# Patient Record
Sex: Female | Born: 1940
Health system: Southern US, Community
[De-identification: ages and names within clinical notes are randomized; demographics above are authoritative.]

## PROBLEM LIST (undated history)

## (undated) DIAGNOSIS — E7800 Pure hypercholesterolemia, unspecified: Secondary | ICD-10-CM

## (undated) DIAGNOSIS — I10 Essential (primary) hypertension: Secondary | ICD-10-CM

---

## 2015-09-21 ENCOUNTER — Encounter (HOSPITAL_BASED_OUTPATIENT_CLINIC_OR_DEPARTMENT_OTHER): Payer: Self-pay | Admitting: *Deleted

## 2015-09-21 ENCOUNTER — Emergency Department (HOSPITAL_BASED_OUTPATIENT_CLINIC_OR_DEPARTMENT_OTHER)
Admission: EM | Admit: 2015-09-21 | Discharge: 2015-09-21 | Disposition: A | Discharge status: Home / Self Care | Payer: Medicare HMO | Attending: Emergency Medicine | Admitting: Emergency Medicine

## 2015-09-21 ENCOUNTER — Emergency Department (HOSPITAL_BASED_OUTPATIENT_CLINIC_OR_DEPARTMENT_OTHER): Payer: Medicare HMO

## 2015-09-21 DIAGNOSIS — I10 Essential (primary) hypertension: Secondary | ICD-10-CM | POA: Diagnosis not present

## 2015-09-21 DIAGNOSIS — E7800 Pure hypercholesterolemia, unspecified: Secondary | ICD-10-CM | POA: Insufficient documentation

## 2015-09-21 DIAGNOSIS — M542 Cervicalgia: Secondary | ICD-10-CM | POA: Diagnosis present

## 2015-09-21 DIAGNOSIS — F17200 Nicotine dependence, unspecified, uncomplicated: Secondary | ICD-10-CM | POA: Diagnosis not present

## 2015-09-21 DIAGNOSIS — M47812 Spondylosis without myelopathy or radiculopathy, cervical region: Secondary | ICD-10-CM | POA: Diagnosis not present

## 2015-09-21 HISTORY — DX: Essential (primary) hypertension: I10

## 2015-09-21 HISTORY — DX: Pure hypercholesterolemia, unspecified: E78.00

## 2015-09-21 MED ORDER — OXYCODONE-ACETAMINOPHEN 5-325 MG PO TABS: 1.0000 | ORAL_TABLET | ORAL | Status: DC | PRN

## 2015-09-21 MED ORDER — ONDANSETRON HCL 4 MG PO TABS: 4.0000 mg | ORAL_TABLET | Freq: Four times a day (QID) | ORAL | Status: DC | PRN

## 2015-09-21 MED ORDER — ONDANSETRON 8 MG PO TBDP
8.0000 mg | ORAL_TABLET | Freq: Once | ORAL | Status: AC
  Administered 2015-09-21: 8 mg via ORAL
  Filled 2015-09-21: qty 1

## 2015-09-21 MED ORDER — CYCLOBENZAPRINE HCL 10 MG PO TABS
10.0000 mg | ORAL_TABLET | Freq: Once | ORAL | Status: AC
  Administered 2015-09-21: 10 mg via ORAL
  Filled 2015-09-21: qty 1

## 2015-09-21 MED ORDER — OXYCODONE-ACETAMINOPHEN 5-325 MG PO TABS
1.0000 | ORAL_TABLET | Freq: Once | ORAL | Status: AC
  Administered 2015-09-21: 1 via ORAL
  Filled 2015-09-21: qty 1

## 2015-09-21 MED ORDER — CYCLOBENZAPRINE HCL 10 MG PO TABS: 10.0000 mg | ORAL_TABLET | Freq: Three times a day (TID) | ORAL | Status: DC | PRN

## 2015-09-21 NOTE — ED Provider Notes (Signed)
CSN: [id number]     Arrival date & time 09/21/15  0138 History   First MD Initiated Contact with Patient 09/21/15 0353     Chief Complaint  Patient presents with  . Neck Injury     (Consider location/radiation/quality/duration/timing/severity/associated sxs/prior Treatment) Patient is a 75 y.o. female presenting with neck injury. The history is provided by the patient.  Neck Injury  She complains of pain in the right side of her neck for the last 2 weeks. She denies any trauma and denies any unusual bending, twisting, lifting, reaching. Pain is not affected by movement and does not radiate. She denies weakness, numbness, tingling. Pain waxes and wanes but can be as severe as 10/10. She has taken ibuprofen and naproxen with minimal relief. She has not had previous problems with her neck.  Past Medical History  Diagnosis Date  . Hypertension   . Hypercholesterolemia    History reviewed. No pertinent past surgical history. History reviewed. No pertinent family history. Social History  Substance Use Topics  . Smoking status: Current Some Day Smoker  . Smokeless tobacco: None  . Alcohol Use: No   OB History    No data available     Review of Systems  All other systems reviewed and are negative.     Allergies  Review of patient's allergies indicates no known allergies.  Home Medications   Prior to Admission medications   Not on File   BP 181/77 mmHg  Pulse 65  Temp(Src) 99.6 F (37.6 C) (Oral)  Resp 18  Ht 5\' 4"  (1.626 m)  Wt 155 lb (70.308 kg)  BMI 26.59 kg/m2  SpO2 97% Physical Exam  Nursing note and vitals reviewed.  75 year old female, resting comfortably and in no acute distress. Vital signs are significant for hypertension. Oxygen saturation is 97%, which is normal. Head is normocephalic and atraumatic. PERRLA, EOMI. Oropharynx is clear. Neck is supple without adenopathy or JVD. There is tenderness to palpation in the right paracervical muscles and also in  the proximal deltoid. Back is nontender and there is no CVA tenderness. Lungs are clear without rales, wheezes, or rhonchi. Chest is nontender. Heart has regular rate and rhythm without murmur. Abdomen is soft, flat, nontender without masses or hepatosplenomegaly and peristalsis is normoactive. Extremities have no cyanosis or edema, full range of motion is present. No rotator cuff impingement signs are present. Right arm has strong pulses, normal sensation, normal strength throughout. Skin is warm and dry without rash. Neurologic: Mental status is normal, cranial nerves are intact, there are no motor or sensory deficits.  ED Course  Procedures (including critical care time)  Imaging Review Ct Cervical Spine Wo Contrast  09/21/2015  CLINICAL DATA:  Right-sided neck pain for 2 weeks. No trauma or surgery. EXAM: CT CERVICAL SPINE WITHOUT CONTRAST TECHNIQUE: Multidetector CT imaging of the cervical spine was performed without intravenous contrast. Multiplanar CT image reconstructions were also generated. COMPARISON:  None. FINDINGS: Normal alignment of the cervical spine. Degenerative changes throughout with narrowed cervical interspaces and endplate hypertrophic changes, most prominent at C4-5, C5-6, C6-7, and C7-T1 levels. Degenerative changes at the C1-2 articulation. Degenerative changes in the cervical facet joints. Prominent posterior osteophytes at C4-5, C5-6, and C6-7 may cause some encroachment upon the central canal. Mild bone encroachment upon the left neural foramen at C5-6. Schmorl's node at the inferior endplate of C3. No prevertebral soft tissue swelling. No vertebral compression deformities. C1-2 articulation appears intact. No focal bone lesion or bone destruction. Soft  tissues are unremarkable. Vascular calcifications. IMPRESSION: Normal alignment of the cervical spine. Diffuse degenerative changes. No acute displaced fractures identified. Electronically Signed   By: Lucienne Capers  M.D.   On: 09/21/2015 04:27   I have personally reviewed and evaluated these images and lab results as part of my medical decision-making.    MDM   Final diagnoses:  Neck pain  Cervical osteoarthritis    Right shoulder pain of uncertain cause. She has no prior records and the [facility] system. She will be sent for CT of cervical spine and is given a dose of cyclobenzaprine and oxycodone-acetaminophen.  She had good pain relief with above noted treatment, but did develop nausea which was felt to be a side effect of oxycodone. She is given ondansetron with good relief of nausea. CT shows extensive degenerative changes. I relayed this information to the patient. She is discharged with prescriptions for oxycodone have acetaminophen, cyclobenzaprine, and ondansetron. She is referred to neurosurgery for evaluation. I suspect that she would benefit from a course of physical therapy.  Delora Fuel, MD [id number]

## 2015-09-21 NOTE — Discharge Instructions (Signed)
Apply ice several times a day. Continue taking ibuprofen or naproxen.  Osteoarthritis Osteoarthritis is a disease that causes soreness and inflammation of a joint. It occurs when the cartilage at the affected joint wears down. Cartilage acts as a cushion, covering the ends of bones where they meet to form a joint. Osteoarthritis is the most common form of arthritis. It often occurs in older people. The joints affected most often by this condition include those in the:  Ends of the fingers.  Thumbs.  Neck.  Lower back.  Knees.  Hips. CAUSES  Over time, the cartilage that covers the ends of bones begins to wear away. This causes bone to rub on bone, producing pain and stiffness in the affected joints.  RISK FACTORS Certain factors can increase your chances of having osteoarthritis, including:  Older age.  Excessive body weight.  Overuse of joints.  Previous joint injury. SIGNS AND SYMPTOMS   Pain, swelling, and stiffness in the joint.  Over time, the joint may lose its normal shape.  Small deposits of bone (osteophytes) may grow on the edges of the joint.  Bits of bone or cartilage can break off and float inside the joint space. This may cause more pain and damage. DIAGNOSIS  Your health care provider will do a physical exam and ask about your symptoms. Various tests may be ordered, such as:  X-rays of the affected joint.  Blood tests to rule out other types of arthritis. Additional tests may be used to diagnose your condition. TREATMENT  Goals of treatment are to control pain and improve joint function. Treatment plans may include:  A prescribed exercise program that allows for rest and joint relief.  A weight control plan.  Pain relief techniques, such as:  Properly applied heat and cold.  Electric pulses delivered to nerve endings under the skin (transcutaneous electrical nerve stimulation [TENS]).  Massage.  Certain nutritional supplements.  Medicines to  control pain, such as:  Acetaminophen.  Nonsteroidal anti-inflammatory drugs (NSAIDs), such as naproxen.  Narcotic or central-acting agents, such as tramadol.  Corticosteroids. These can be given orally or as an injection.  Surgery to reposition the bones and relieve pain (osteotomy) or to remove loose pieces of bone and cartilage. Joint replacement may be needed in advanced states of osteoarthritis. HOME CARE INSTRUCTIONS   Take medicines only as directed by your health care provider.  Maintain a healthy weight. Follow your health care provider's instructions for weight control. This may include dietary instructions.  Exercise as directed. Your health care provider can recommend specific types of exercise. These may include:  Strengthening exercises. These are done to strengthen the muscles that support joints affected by arthritis. They can be performed with weights or with exercise bands to add resistance.  Aerobic activities. These are exercises, such as brisk walking or low-impact aerobics, that get your heart pumping.  Range-of-motion activities. These keep your joints limber.  Balance and agility exercises. These help you maintain daily living skills.  Rest your affected joints as directed by your health care provider.  Keep all follow-up visits as directed by your health care provider. SEEK MEDICAL CARE IF:   Your skin turns red.  You develop a rash in addition to your joint pain.  You have worsening joint pain.  You have a fever along with joint or muscle aches. SEEK IMMEDIATE MEDICAL CARE IF:  You have a significant loss of weight or appetite.  You have night sweats. [place]  of Arthritis and Musculoskeletal and Skin Diseases: www.niams.SouthExposed.es  [employer] on Aging: http://kim-miller.com/  American College of Rheumatology: www.rheumatology.org   This information is not intended to replace advice given to you by your  health care provider. Make sure you discuss any questions you have with your health care provider.   Document Released: 03/24/2005 Document Revised: 04/14/2014 Document Reviewed: 11/29/2012 Elsevier Interactive Patient Education 2016 Elsevier Inc.  Cyclobenzaprine tablets What is this medicine? CYCLOBENZAPRINE (sye kloe BEN za preen) is a muscle relaxer. It is used to treat muscle pain, spasms, and stiffness. This medicine may be used for other purposes; ask your health care provider or pharmacist if you have questions. What should I tell my health care provider before I take this medicine? They need to know if you have any of these conditions: -heart disease, irregular heartbeat, or previous heart attack -liver disease -thyroid problem -an unusual or allergic reaction to cyclobenzaprine, tricyclic antidepressants, lactose, other medicines, foods, dyes, or preservatives -pregnant or trying to get pregnant -breast-feeding How should I use this medicine? Take this medicine by mouth with a glass of water. Follow the directions on the prescription label. If this medicine upsets your stomach, take it with food or milk. Take your medicine at regular intervals. Do not take it more often than directed. Talk to your pediatrician regarding the use of this medicine in children. Special care may be needed. Overdosage: If you think you have taken too much of this medicine contact a poison control center or emergency room at once. NOTE: This medicine is only for you. Do not share this medicine with others. What if I miss a dose? If you miss a dose, take it as soon as you can. If it is almost time for your next dose, take only that dose. Do not take double or extra doses. What may interact with this medicine? Do not take this medicine with any of the following medications: -certain medicines for fungal infections like fluconazole, itraconazole, ketoconazole, posaconazole,  voriconazole -cisapride -dofetilide -dronedarone -droperidol -flecainide -grepafloxacin -halofantrine -levomethadyl -MAOIs like Carbex, Eldepryl, Marplan, Nardil, and Parnate -nilotinib -pimozide -probucol -sertindole -thioridazine -ziprasidone This medicine may also interact with the following medications: -abarelix -alcohol -certain medicines for cancer -certain medicines for depression, anxiety, or psychotic disturbances -certain medicines for infection like alfuzosin, chloroquine, clarithromycin, levofloxacin, mefloquine, pentamidine, troleandomycin -certain medicines for an irregular heart beat -certain medicines used for sleep or numbness during surgery or procedure -contrast dyes -dolasetron -guanethidine -methadone -octreotide -ondansetron -other medicines that prolong the QT interval (cause an abnormal heart rhythm) -palonosetron -phenothiazines like chlorpromazine, mesoridazine, prochlorperazine, thioridazine -tramadol -vardenafil This list may not describe all possible interactions. Give your health care provider a list of all the medicines, herbs, non-prescription drugs, or dietary supplements you use. Also tell them if you smoke, drink alcohol, or use illegal drugs. Some items may interact with your medicine. What should I watch for while using this medicine? Check with your doctor or health care professional if your condition does not improve within 1 to 3 weeks. You may get drowsy or dizzy when you first start taking the medicine or change doses. Do not drive, use machinery, or do anything that may be dangerous until you know how the medicine affects you. Stand or sit up slowly. Your mouth may get dry. Drinking water, chewing sugarless gum, or sucking on hard candy may help. What side effects may I notice from receiving this medicine? Side effects that you should report to your doctor or health  care professional as soon as possible: -allergic reactions like  skin rash, itching or hives, swelling of the face, lips, or tongue -chest pain -fast heartbeat -hallucinations -seizures -vomiting Side effects that usually do not require medical attention (report to your doctor or health care professional if they continue or are bothersome): -headache This list may not describe all possible side effects. Call your doctor for medical advice about side effects. You may report side effects to FDA at 1-800-FDA-1088. Where should I keep my medicine? Keep out of the reach of children. Store at room temperature between 15 and 30 degrees C (59 and 86 degrees F). Keep container tightly closed. Throw away any unused medicine after the expiration date. NOTE: This sheet is a summary. It may not cover all possible information. If you have questions about this medicine, talk to your doctor, pharmacist, or health care provider.    2016, Elsevier/Gold Standard. (2012-10-19 12:48:19)  Acetaminophen; Oxycodone tablets What is this medicine? ACETAMINOPHEN; OXYCODONE (a set a MEE noe fen; ox i KOE done) is a pain reliever. It is used to treat moderate to severe pain. This medicine may be used for other purposes; ask your health care provider or pharmacist if you have questions. What should I tell my health care provider before I take this medicine? They need to know if you have any of these conditions: -brain tumor -Crohn's disease, inflammatory bowel disease, or ulcerative colitis -drug abuse or addiction -head injury -heart or circulation problems -if you often drink alcohol -kidney disease or problems going to the bathroom -liver disease -lung disease, asthma, or breathing problems -an unusual or allergic reaction to acetaminophen, oxycodone, other opioid analgesics, other medicines, foods, dyes, or preservatives -pregnant or trying to get pregnant -breast-feeding How should I use this medicine? Take this medicine by mouth with a full glass of water. Follow the  directions on the prescription label. You can take it with or without food. If it upsets your stomach, take it with food. Take your medicine at regular intervals. Do not take it more often than directed. Talk to your pediatrician regarding the use of this medicine in children. Special care may be needed. Patients over 92 years old may have a stronger reaction and need a smaller dose. Overdosage: If you think you have taken too much of this medicine contact a poison control center or emergency room at once. NOTE: This medicine is only for you. Do not share this medicine with others. What if I miss a dose? If you miss a dose, take it as soon as you can. If it is almost time for your next dose, take only that dose. Do not take double or extra doses. What may interact with this medicine? -alcohol -antihistamines -barbiturates like amobarbital, butalbital, butabarbital, methohexital, pentobarbital, phenobarbital, thiopental, and secobarbital -benztropine -drugs for bladder problems like solifenacin, trospium, oxybutynin, tolterodine, hyoscyamine, and methscopolamine -drugs for breathing problems like ipratropium and tiotropium -drugs for certain stomach or intestine problems like propantheline, homatropine methylbromide, glycopyrrolate, atropine, belladonna, and dicyclomine -general anesthetics like etomidate, ketamine, nitrous oxide, propofol, desflurane, enflurane, halothane, isoflurane, and sevoflurane -medicines for depression, anxiety, or psychotic disturbances -medicines for sleep -muscle relaxants -naltrexone -narcotic medicines (opiates) for pain -phenothiazines like perphenazine, thioridazine, chlorpromazine, mesoridazine, fluphenazine, prochlorperazine, promazine, and trifluoperazine -scopolamine -tramadol -trihexyphenidyl This list may not describe all possible interactions. Give your health care provider a list of all the medicines, herbs, non-prescription drugs, or dietary  supplements you use. Also tell them if you smoke, drink alcohol, or  use illegal drugs. Some items may interact with your medicine. What should I watch for while using this medicine? Tell your doctor or health care professional if your pain does not go away, if it gets worse, or if you have new or a different type of pain. You may develop tolerance to the medicine. Tolerance means that you will need a higher dose of the medication for pain relief. Tolerance is normal and is expected if you take this medicine for a long time. Do not suddenly stop taking your medicine because you may develop a severe reaction. Your body becomes used to the medicine. This does NOT mean you are addicted. Addiction is a behavior related to getting and using a drug for a non-medical reason. If you have pain, you have a medical reason to take pain medicine. Your doctor will tell you how much medicine to take. If your doctor wants you to stop the medicine, the dose will be slowly lowered over time to avoid any side effects. You may get drowsy or dizzy. Do not drive, use machinery, or do anything that needs mental alertness until you know how this medicine affects you. Do not stand or sit up quickly, especially if you are an older patient. This reduces the risk of dizzy or fainting spells. Alcohol may interfere with the effect of this medicine. Avoid alcoholic drinks. There are different types of narcotic medicines (opiates) for pain. If you take more than one type at the same time, you may have more side effects. Give your health care provider a list of all medicines you use. Your doctor will tell you how much medicine to take. Do not take more medicine than directed. Call emergency for help if you have problems breathing. The medicine will cause constipation. Try to have a bowel movement at least every 2 to 3 days. If you do not have a bowel movement for 3 days, call your doctor or health care professional. Do not take Tylenol  (acetaminophen) or medicines that have acetaminophen with this medicine. Too much acetaminophen can be very dangerous. Many nonprescription medicines contain acetaminophen. Always read the labels carefully to avoid taking more acetaminophen. What side effects may I notice from receiving this medicine? Side effects that you should report to your doctor or health care professional as soon as possible: -allergic reactions like skin rash, itching or hives, swelling of the face, lips, or tongue -breathing difficulties, wheezing -confusion -light headedness or fainting spells -severe stomach pain -unusually weak or tired -yellowing of the skin or the whites of the eyes Side effects that usually do not require medical attention (report to your doctor or health care professional if they continue or are bothersome): -dizziness -drowsiness -nausea -vomiting This list may not describe all possible side effects. Call your doctor for medical advice about side effects. You may report side effects to FDA at 1-800-FDA-1088. Where should I keep my medicine? Keep out of the reach of children. This medicine can be abused. Keep your medicine in a safe place to protect it from theft. Do not share this medicine with anyone. Selling or giving away this medicine is dangerous and against the law. This medicine may cause accidental overdose and death if it taken by other adults, children, or pets. Mix any unused medicine with a substance like cat litter or coffee grounds. Then throw the medicine away in a sealed container like a sealed bag or a coffee can with a lid. Do not use the medicine after the expiration  date. Store at room temperature between 20 and 25 degrees C (68 and 77 degrees F). NOTE: This sheet is a summary. It may not cover all possible information. If you have questions about this medicine, talk to your doctor, pharmacist, or health care provider.    2016, Elsevier/Gold Standard. (2014-02-22  15:18:46)  Ondansetron tablets What is this medicine? ONDANSETRON (on DAN se tron) is used to treat nausea and vomiting caused by chemotherapy. It is also used to prevent or treat nausea and vomiting after surgery. This medicine may be used for other purposes; ask your health care provider or pharmacist if you have questions. What should I tell my health care provider before I take this medicine? They need to know if you have any of these conditions: -heart disease -history of irregular heartbeat -liver disease -low levels of magnesium or potassium in the blood -an unusual or allergic reaction to ondansetron, granisetron, other medicines, foods, dyes, or preservatives -pregnant or trying to get pregnant -breast-feeding How should I use this medicine? Take this medicine by mouth with a glass of water. Follow the directions on your prescription label. Take your doses at regular intervals. Do not take your medicine more often than directed. Talk to your pediatrician regarding the use of this medicine in children. Special care may be needed. Overdosage: If you think you have taken too much of this medicine contact a poison control center or emergency room at once. NOTE: This medicine is only for you. Do not share this medicine with others. What if I miss a dose? If you miss a dose, take it as soon as you can. If it is almost time for your next dose, take only that dose. Do not take double or extra doses. What may interact with this medicine? Do not take this medicine with any of the following medications: -apomorphine -certain medicines for fungal infections like fluconazole, itraconazole, ketoconazole, posaconazole, voriconazole -cisapride -dofetilide -dronedarone -pimozide -thioridazine -ziprasidone This medicine may also interact with the following medications: -carbamazepine -certain medicines for depression, anxiety, or psychotic disturbances -fentanyl -linezolid -MAOIs like  Carbex, Eldepryl, Marplan, Nardil, and Parnate -methylene blue (injected into a vein) -other medicines that prolong the QT interval (cause an abnormal heart rhythm) -phenytoin -rifampicin -tramadol This list may not describe all possible interactions. Give your health care provider a list of all the medicines, herbs, non-prescription drugs, or dietary supplements you use. Also tell them if you smoke, drink alcohol, or use illegal drugs. Some items may interact with your medicine. What should I watch for while using this medicine? Check with your doctor or health care professional right away if you have any sign of an allergic reaction. What side effects may I notice from receiving this medicine? Side effects that you should report to your doctor or health care professional as soon as possible: -allergic reactions like skin rash, itching or hives, swelling of the face, lips or tongue -breathing problems -confusion -dizziness -fast or irregular heartbeat -feeling faint or lightheaded, falls -fever and chills -loss of balance or coordination -seizures -sweating -swelling of the hands or feet -tightness in the chest -tremors -unusually weak or tired Side effects that usually do not require medical attention (report to your doctor or health care professional if they continue or are bothersome): -constipation or diarrhea -headache This list may not describe all possible side effects. Call your doctor for medical advice about side effects. You may report side effects to FDA at 1-800-FDA-1088. Where should I keep my medicine? Keep out  of the reach of children. Store between 2 and 30 degrees C (36 and 86 degrees F). Throw away any unused medicine after the expiration date. NOTE: This sheet is a summary. It may not cover all possible information. If you have questions about this medicine, talk to your doctor, pharmacist, or health care provider.    2016, Elsevier/Gold Standard. (2012-12-29  16:27:45)

## 2015-09-21 NOTE — ED Notes (Signed)
Pt to CT

## 2015-09-21 NOTE — ED Notes (Signed)
Updated on wait, NAD, calm, "feel better after applying heat", family at [facility] x2, updated on wait, Dr. Roxanne Mins into room.

## 2015-09-21 NOTE — ED Notes (Addendum)
C/o R neck/upper shoulder pain, ongoing for ~ 2 weeks, seems to be getting worse, has tried tylenol and naproxen w/o relief, (denies: numbness, tingling, sob, dizziness, nv or other sx), "not aggravated by movement or use".  "Takes medicine for htn and cholesterol, cannot remember names of meds".

## 2015-12-12 ENCOUNTER — Emergency Department (HOSPITAL_BASED_OUTPATIENT_CLINIC_OR_DEPARTMENT_OTHER)
Admission: EM | Admit: 2015-12-12 | Discharge: 2015-12-12 | Disposition: A | Discharge status: Home / Self Care | Payer: Medicare HMO | Attending: Emergency Medicine | Admitting: Emergency Medicine

## 2015-12-12 ENCOUNTER — Emergency Department (HOSPITAL_BASED_OUTPATIENT_CLINIC_OR_DEPARTMENT_OTHER): Payer: Medicare HMO

## 2015-12-12 ENCOUNTER — Encounter (HOSPITAL_BASED_OUTPATIENT_CLINIC_OR_DEPARTMENT_OTHER): Payer: Self-pay

## 2015-12-12 DIAGNOSIS — Z79899 Other long term (current) drug therapy: Secondary | ICD-10-CM | POA: Insufficient documentation

## 2015-12-12 DIAGNOSIS — S40811A Abrasion of right upper arm, initial encounter: Secondary | ICD-10-CM | POA: Diagnosis not present

## 2015-12-12 DIAGNOSIS — Y999 Unspecified external cause status: Secondary | ICD-10-CM | POA: Diagnosis not present

## 2015-12-12 DIAGNOSIS — Y939 Activity, unspecified: Secondary | ICD-10-CM | POA: Insufficient documentation

## 2015-12-12 DIAGNOSIS — Y92410 Unspecified street and highway as the place of occurrence of the external cause: Secondary | ICD-10-CM | POA: Diagnosis not present

## 2015-12-12 DIAGNOSIS — I10 Essential (primary) hypertension: Secondary | ICD-10-CM | POA: Diagnosis not present

## 2015-12-12 DIAGNOSIS — Z87891 Personal history of nicotine dependence: Secondary | ICD-10-CM | POA: Diagnosis not present

## 2015-12-12 DIAGNOSIS — S59911A Unspecified injury of right forearm, initial encounter: Secondary | ICD-10-CM | POA: Diagnosis present

## 2015-12-12 MED ORDER — TRAMADOL HCL 50 MG PO TABS: 50.0000 mg | ORAL_TABLET | Freq: Four times a day (QID) | ORAL | 0 refills | Status: DC | PRN
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
## 2015-12-12 NOTE — ED Notes (Signed)
Patient transported to X-ray 

## 2015-12-12 NOTE — Discharge Instructions (Signed)
Please read attached information. If you experience any new or worsening signs or symptoms please return to the emergency room for evaluation. Please follow-up with your primary care provider or specialist as discussed. Please use medication prescribed only as directed and discontinue taking if you have any concerning signs or symptoms.   °
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
## 2015-12-12 NOTE — ED Notes (Signed)
Pt and family given d/c instructions as per chart. Verbalizes understanding. No questions. Rx x 1 

## 2016-01-30 ENCOUNTER — Ambulatory Visit: Payer: Self-pay | Admitting: Surgery

## 2016-01-30 DIAGNOSIS — N6091 Unspecified benign mammary dysplasia of right breast: Secondary | ICD-10-CM

## 2016-02-27 ENCOUNTER — Other Ambulatory Visit: Payer: Self-pay | Admitting: Surgery

## 2016-02-27 DIAGNOSIS — N6091 Unspecified benign mammary dysplasia of right breast: Secondary | ICD-10-CM

## 2016-03-26 ENCOUNTER — Encounter (HOSPITAL_BASED_OUTPATIENT_CLINIC_OR_DEPARTMENT_OTHER): Payer: Self-pay | Admitting: *Deleted

## 2016-03-26 NOTE — Progress Notes (Signed)
Bring all medications. Pt does not drive so could not come in for labs. Will do BMET and Ekg day of surgery.

## 2016-04-01 ENCOUNTER — Encounter (HOSPITAL_BASED_OUTPATIENT_CLINIC_OR_DEPARTMENT_OTHER): Payer: Self-pay | Admitting: Surgery

## 2016-04-01 DIAGNOSIS — D241 Benign neoplasm of right breast: Secondary | ICD-10-CM | POA: Diagnosis present

## 2016-04-01 DIAGNOSIS — N6091 Unspecified benign mammary dysplasia of right breast: Secondary | ICD-10-CM | POA: Diagnosis present

## 2016-04-01 NOTE — H&P (Signed)
General Surgery [facility]  Natasha Hill DOB: [birth year] Married / Language: English / Race: White Female  History of Present Illness  Patient words: breast.  The patient is a 75 year old female who presents with a complaint of Breast problems.  Patient is referred by Dr. Rochel Brome in [place] for evaluation and management of atypical ductal hyperplasia and intraductal papilloma of the right breast. Patient is accompanied by her daughter. Patient had undergone a routine screening mammogram and was found to have an abnormality in the right breast. She subsequently underwent needle biopsy showing atypical ductal hyperplasia and intraductal papilloma. She is now referred to surgery for excision for definitive diagnosis and management. Patient has no prior history of breast disease. She has had no prior breast biopsies. She denies any findings on self-exam. She denies nipple discharge.   Other Problems Arthritis  High blood pressure  Lump In Breast   Past Surgical History No pertinent past surgical history   Diagnostic Studies History Colonoscopy  never Mammogram  within last year  Allergies No Known Allergies 01/30/2016  Medication History TraMADol HCl (50MG  Tablet, Oral) Active. Lisinopril-Hydrochlorothiazide (10-12.5MG  Tablet, Oral) Active. Aspirin (81MG  Tablet DR, Oral) Active. Medications Reconciled  Social History  Caffeine use  Carbonated beverages, Coffee, Tea. No alcohol use  No drug use  Tobacco use  Former smoker.  Family History Family history unknown  First Degree Relatives   Pregnancy / Birth History Age at menarche  31 years. Age of menopause  22-50 Gravida  2 Maternal age  62-25 Para  2  Review of Systems  General Not Present- Appetite Loss, Chills, Fatigue, Fever, Night Sweats, Weight Gain and Weight Loss. Skin Not Present- Change in Wart/Mole, Dryness, Hives, Jaundice, New Lesions, Non-Healing  Wounds, Rash and Ulcer. HEENT Present- Wears glasses/contact lenses. Not Present- Earache, Hearing Loss, Hoarseness, Nose Bleed, Oral Ulcers, Ringing in the Ears, Seasonal Allergies, Sinus Pain, Sore Throat, Visual Disturbances and Yellow Eyes. Respiratory Not Present- Bloody sputum, Chronic Cough, Difficulty Breathing, Snoring and Wheezing. Breast Present- Breast Mass. Not Present- Breast Pain, Nipple Discharge and Skin Changes. Cardiovascular Present- Leg Cramps and Swelling of Extremities. Not Present- Chest Pain, Difficulty Breathing Lying Down, Palpitations, Rapid Heart Rate and Shortness of Breath. Gastrointestinal Not Present- Abdominal Pain, Bloating, Bloody Stool, Change in Bowel Habits, Chronic diarrhea, Constipation, Difficulty Swallowing, Excessive gas, Gets full quickly at meals, Hemorrhoids, Indigestion, Nausea, Rectal Pain and Vomiting. Musculoskeletal Present- Joint Stiffness, Muscle Weakness and Swelling of Extremities. Not Present- Back Pain, Joint Pain and Muscle Pain. Neurological Present- Decreased Memory and Numbness. Not Present- Fainting, Headaches, Seizures, Tingling, Tremor, Trouble walking and Weakness. Psychiatric Not Present- Anxiety, Bipolar, Change in Sleep Pattern, Depression, Fearful and Frequent crying. Endocrine Not Present- Cold Intolerance, Excessive Hunger, Hair Changes, Heat Intolerance, Hot flashes and New Diabetes. Hematology Not Present- Blood Thinners, Easy Bruising, Excessive bleeding, Gland problems, HIV and Persistent Infections.  Vitals  Weight: 134 lb Height: 64in Body Surface Area: 1.65 m Body Mass Index: 23 kg/m  Temp.: 98.47F(Oral)  Pulse: 85 (Regular)  BP: 140/88 (Sitting, Left Arm, Standard)   Physical Exam The physical exam findings are as follows: Note:General - appears comfortable, no distress; not diaphorectic  HEENT - normocephalic; sclerae clear, gaze conjugate; mucous membranes moist, dentition good; voice  normal  Neck - symmetric on extension; no palpable anterior or posterior cervical adenopathy; no palpable masses in the thyroid bed  Chest - clear bilaterally without rhonchi, rales, or wheeze  Cor - regular  rhythm with normal rate; no significant murmur  Breast - right breast shows normal nipple areolar complex; breast parenchyma is diffusely nodular without discrete or dominant mass; well-healed needle biopsy site lower outer quadrant with slight ecchymosis  Ext - non-tender without significant edema or lymphedema  Neuro - grossly intact; no tremor    Assessment & Plan  ATYPICAL DUCTAL HYPERPLASIA OF RIGHT BREAST (N60.91) INTRADUCTAL PAPILLOMA OF BREAST, RIGHT (D24.1)  Patient presents on referral from her primary care physician for surgical management of atypical ductal hyperplasia and intraductal papilloma involving the right breast.  I discussed the need for wire localization followed by surgical excision. This can be performed as an outpatient procedure. He discussed the location and size of the surgical incision. Patient and her daughter understand and wish to proceed with surgery in the near future.  The risks and benefits of the procedure have been discussed at length with the patient. The patient understands the proposed procedure, potential alternative treatments, and the course of recovery to be expected. All of the patient's questions have been answered at this time. The patient wishes to proceed with surgery.   Earnstine Regal, MD, [facility] Office: [phone]

## 2016-04-02 ENCOUNTER — Ambulatory Visit
Admission: RE | Admit: 2016-04-02 | Discharge: 2016-04-02 | Disposition: A | Discharge status: Home / Self Care | Payer: Medicare HMO | Source: Ambulatory Visit | Attending: Surgery | Admitting: Surgery

## 2016-04-02 ENCOUNTER — Encounter (HOSPITAL_BASED_OUTPATIENT_CLINIC_OR_DEPARTMENT_OTHER)
Admission: RE | Disposition: A | Discharge status: Home / Self Care | Payer: Self-pay | Source: Ambulatory Visit | Attending: Surgery

## 2016-04-02 ENCOUNTER — Ambulatory Visit (HOSPITAL_BASED_OUTPATIENT_CLINIC_OR_DEPARTMENT_OTHER)
Admission: RE | Admit: 2016-04-02 | Discharge: 2016-04-02 | Disposition: A | Discharge status: Home / Self Care | Payer: Medicare HMO | Source: Ambulatory Visit | Attending: Surgery | Admitting: Surgery

## 2016-04-02 ENCOUNTER — Encounter (HOSPITAL_BASED_OUTPATIENT_CLINIC_OR_DEPARTMENT_OTHER): Payer: Self-pay

## 2016-04-02 ENCOUNTER — Ambulatory Visit (HOSPITAL_BASED_OUTPATIENT_CLINIC_OR_DEPARTMENT_OTHER): Payer: Medicare HMO | Admitting: Certified Registered"

## 2016-04-02 DIAGNOSIS — Z87891 Personal history of nicotine dependence: Secondary | ICD-10-CM | POA: Insufficient documentation

## 2016-04-02 DIAGNOSIS — N6091 Unspecified benign mammary dysplasia of right breast: Secondary | ICD-10-CM | POA: Diagnosis not present

## 2016-04-02 DIAGNOSIS — I10 Essential (primary) hypertension: Secondary | ICD-10-CM | POA: Diagnosis not present

## 2016-04-02 DIAGNOSIS — Z7982 Long term (current) use of aspirin: Secondary | ICD-10-CM | POA: Diagnosis not present

## 2016-04-02 DIAGNOSIS — D241 Benign neoplasm of right breast: Secondary | ICD-10-CM | POA: Insufficient documentation

## 2016-04-02 HISTORY — PX: BREAST BIOPSY: SHX20

## 2016-04-02 LAB — POCT I-STAT, CHEM 8
BUN: 34 mg/dL — AB (ref 6–20)
CALCIUM ION: 1.03 mmol/L — AB (ref 1.15–1.40)
CREATININE: 1.5 mg/dL — AB (ref 0.44–1.00)
Chloride: 105 mmol/L (ref 101–111)
GLUCOSE: 89 mg/dL (ref 65–99)
HCT: 37 % (ref 36.0–46.0)
Hemoglobin: 12.6 g/dL (ref 12.0–15.0)
Potassium: 3.9 mmol/L (ref 3.5–5.1)
Sodium: 139 mmol/L (ref 135–145)
TCO2: 25 mmol/L (ref 0–100)

## 2016-04-02 SURGERY — BREAST BIOPSY WITH NEEDLE LOCALIZATION
Anesthesia: General | Site: Breast | Laterality: Right

## 2016-04-02 MED ORDER — ONDANSETRON HCL 4 MG/2ML IJ SOLN
INTRAMUSCULAR | Status: AC
  Filled 2016-04-02: qty 2

## 2016-04-02 MED ORDER — ONDANSETRON HCL 4 MG/2ML IJ SOLN
INTRAMUSCULAR | Status: AC
  Filled 2016-04-02: qty 8

## 2016-04-02 MED ORDER — LIDOCAINE 2% (20 MG/ML) 5 ML SYRINGE
INTRAMUSCULAR | Status: AC
  Filled 2016-04-02: qty 5

## 2016-04-02 MED ORDER — PROPOFOL 10 MG/ML IV BOLUS
INTRAVENOUS | Status: DC | PRN
  Administered 2016-04-02: 150 mg via INTRAVENOUS

## 2016-04-02 MED ORDER — DEXAMETHASONE SODIUM PHOSPHATE 4 MG/ML IJ SOLN
INTRAMUSCULAR | Status: DC | PRN
  Administered 2016-04-02: 4 mg via INTRAVENOUS

## 2016-04-02 MED ORDER — FENTANYL CITRATE (PF) 100 MCG/2ML IJ SOLN
INTRAMUSCULAR | Status: AC
  Filled 2016-04-02: qty 2

## 2016-04-02 MED ORDER — LIDOCAINE HCL (CARDIAC) 20 MG/ML IV SOLN
INTRAVENOUS | Status: DC | PRN
  Administered 2016-04-02: 60 mg via INTRAVENOUS

## 2016-04-02 MED ORDER — CHLORHEXIDINE GLUCONATE CLOTH 2 % EX PADS: 6.0000 | MEDICATED_PAD | Freq: Once | CUTANEOUS | Status: DC

## 2016-04-02 MED ORDER — ONDANSETRON HCL 4 MG/2ML IJ SOLN
INTRAMUSCULAR | Status: DC | PRN
  Administered 2016-04-02: 4 mg via INTRAVENOUS

## 2016-04-02 MED ORDER — BUPIVACAINE HCL (PF) 0.5 % IJ SOLN
INTRAMUSCULAR | Status: DC | PRN
  Administered 2016-04-02: 7 mL

## 2016-04-02 MED ORDER — MIDAZOLAM HCL 2 MG/2ML IJ SOLN: 1.0000 mg | INTRAMUSCULAR | Status: DC | PRN

## 2016-04-02 MED ORDER — FENTANYL CITRATE (PF) 100 MCG/2ML IJ SOLN
50.0000 ug | INTRAMUSCULAR | Status: DC | PRN
  Administered 2016-04-02 (×2): 50 ug via INTRAVENOUS

## 2016-04-02 MED ORDER — CEFAZOLIN SODIUM-DEXTROSE 2-4 GM/100ML-% IV SOLN
2.0000 g | INTRAVENOUS | Status: AC
  Administered 2016-04-02: 2 g via INTRAVENOUS

## 2016-04-02 MED ORDER — CEFAZOLIN SODIUM-DEXTROSE 2-4 GM/100ML-% IV SOLN
INTRAVENOUS | Status: AC
  Filled 2016-04-02: qty 100

## 2016-04-02 MED ORDER — LACTATED RINGERS IV SOLN
INTRAVENOUS | Status: DC
  Administered 2016-04-02 (×2): via INTRAVENOUS

## 2016-04-02 MED ORDER — SCOPOLAMINE 1 MG/3DAYS TD PT72: 1.0000 | MEDICATED_PATCH | Freq: Once | TRANSDERMAL | Status: DC | PRN

## 2016-04-02 MED ORDER — DEXAMETHASONE SODIUM PHOSPHATE 10 MG/ML IJ SOLN
INTRAMUSCULAR | Status: AC
  Filled 2016-04-02: qty 1

## 2016-04-02 MED ORDER — HYDROCODONE-ACETAMINOPHEN 5-325 MG PO TABS: 1.0000 | ORAL_TABLET | ORAL | 0 refills | Status: AC | PRN

## 2016-04-02 MED ORDER — EPHEDRINE 5 MG/ML INJ
INTRAVENOUS | Status: AC
  Filled 2016-04-02: qty 10

## 2016-04-02 SURGICAL SUPPLY — 39 items
BENZOIN TINCTURE PRP APPL 2/3 (GAUZE/BANDAGES/DRESSINGS) ×3 IMPLANT
BLADE HEX COATED 2.75 (ELECTRODE) ×3 IMPLANT
BLADE SURG 15 STRL LF DISP TIS (BLADE) ×1 IMPLANT
BLADE SURG 15 STRL SS (BLADE) ×2
CANISTER SUCT 1200ML W/VALVE (MISCELLANEOUS) IMPLANT
CHLORAPREP W/TINT 26ML (MISCELLANEOUS) ×3 IMPLANT
CLIP TI WIDE RED SMALL 6 (CLIP) IMPLANT
CLOSURE WOUND 1/2 X4 (GAUZE/BANDAGES/DRESSINGS) ×1
COVER BACK TABLE 60X90IN (DRAPES) ×3 IMPLANT
COVER MAYO STAND STRL (DRAPES) ×3 IMPLANT
DECANTER SPIKE VIAL GLASS SM (MISCELLANEOUS) IMPLANT
DEVICE DUBIN W/COMP PLATE 8390 (MISCELLANEOUS) IMPLANT
DRAPE LAPAROTOMY 100X72 PEDS (DRAPES) ×3 IMPLANT
DRAPE UTILITY XL STRL (DRAPES) ×3 IMPLANT
ELECT REM PT RETURN 9FT ADLT (ELECTROSURGICAL) ×3
ELECTRODE REM PT RTRN 9FT ADLT (ELECTROSURGICAL) ×1 IMPLANT
GLOVE SURG ORTHO 8.0 STRL STRW (GLOVE) ×3 IMPLANT
GOWN STRL REUS W/ TWL LRG LVL3 (GOWN DISPOSABLE) ×1 IMPLANT
GOWN STRL REUS W/ TWL XL LVL3 (GOWN DISPOSABLE) ×1 IMPLANT
GOWN STRL REUS W/TWL LRG LVL3 (GOWN DISPOSABLE) ×2
GOWN STRL REUS W/TWL XL LVL3 (GOWN DISPOSABLE) ×2
KIT MARKER MARGIN INK (KITS) IMPLANT
NEEDLE HYPO 25X1 1.5 SAFETY (NEEDLE) ×3 IMPLANT
NS IRRIG 1000ML POUR BTL (IV SOLUTION) ×3 IMPLANT
PACK BASIN DAY SURGERY FS (CUSTOM PROCEDURE TRAY) ×3 IMPLANT
PENCIL BUTTON HOLSTER BLD 10FT (ELECTRODE) ×3 IMPLANT
SLEEVE SCD COMPRESS KNEE MED (MISCELLANEOUS) IMPLANT
SPONGE LAP 4X18 X RAY DECT (DISPOSABLE) ×3 IMPLANT
STRIP CLOSURE SKIN 1/2X4 (GAUZE/BANDAGES/DRESSINGS) ×2 IMPLANT
SUT MNCRL AB 4-0 PS2 18 (SUTURE) ×6 IMPLANT
SUT VIC AB 3-0 SH 27 (SUTURE) ×2
SUT VIC AB 3-0 SH 27X BRD (SUTURE) ×1 IMPLANT
SUT VICRYL 4-0 PS2 18IN ABS (SUTURE) IMPLANT
SYR CONTROL 10ML LL (SYRINGE) ×3 IMPLANT
TOWEL OR 17X24 6PK STRL BLUE (TOWEL DISPOSABLE) ×6 IMPLANT
TOWEL OR NON WOVEN STRL DISP B (DISPOSABLE) ×3 IMPLANT
TUBE CONNECTING 20'X1/4 (TUBING)
TUBE CONNECTING 20X1/4 (TUBING) IMPLANT
YANKAUER SUCT BULB TIP NO VENT (SUCTIONS) IMPLANT

## 2016-04-02 NOTE — Transfer of Care (Signed)
Immediate Anesthesia Transfer of Care Note  Patient: Natasha Hill  Procedure(s) Performed: Procedure(s) with comments: RIGHT BREAST EXCISIONAL BIOPSY WITH NEEDLE LOCALIZATION (Right) - RIGHT BREAST EXCISIONAL BIOPSY WITH NEEDLE LOCALIZATION  Patient Location: PACU  Anesthesia Type:General  Level of Consciousness: awake and patient cooperative  Airway & Oxygen Therapy: Patient Spontanous Breathing and Patient connected to face mask oxygen  Post-op Assessment: Report given to RN and Post -op Vital signs reviewed and stable  Post vital signs: Reviewed and stable  Last Vitals:  Vitals:   04/02/16 1152  BP: (!) 177/73  Pulse: 77  Resp: 20  Temp: 36.7 C    Last Pain:  Vitals:   04/02/16 1152  TempSrc: Oral         Complications: No apparent anesthesia complications

## 2016-04-02 NOTE — Brief Op Note (Signed)
04/02/2016  1:59 PM  PATIENT:  Natasha Hill  75 y.o. female  PRE-OPERATIVE DIAGNOSIS:  ATYPICAL DUCTAL HYPERPLASIA,INTRADUCTAL [place]  POST-OPERATIVE DIAGNOSIS:  ATYPICAL DUCTAL HYPERPLASIA,INTRADUCTAL [place]:  Procedure(s) with comments: RIGHT BREAST EXCISIONAL BIOPSY WITH NEEDLE LOCALIZATION (Right) - RIGHT BREAST EXCISIONAL BIOPSY WITH NEEDLE LOCALIZATION  SURGEON:  Surgeon(s) and Role:    * Armandina Gemma, MD - Primary  ANESTHESIA:   general  EBL:  Total I/O In: 1000 [I.V.:1000] Out: -   BLOOD ADMINISTERED:none  DRAINS: none   LOCAL MEDICATIONS USED:  MARCAINE     SPECIMEN:  Excision  DISPOSITION OF SPECIMEN:  PATHOLOGY  COUNTS:  YES  TOURNIQUET:  * No tourniquets in log *  DICTATION: .Other Dictation: Dictation Number [id number]  PLAN OF CARE: Discharge to home after PACU  PATIENT DISPOSITION:  PACU - hemodynamically stable.   Delay start of Pharmacological VTE agent (>24hrs) due to surgical blood loss or risk of bleeding: yes  Earnstine Regal, MD, [facility] Office: [phone]

## 2016-04-02 NOTE — Anesthesia Postprocedure Evaluation (Signed)
Anesthesia Post Note  Patient: Natasha Hill  Procedure(s) Performed: Procedure(s) (LRB): RIGHT BREAST EXCISIONAL BIOPSY WITH NEEDLE LOCALIZATION (Right)  Patient location during evaluation: PACU Anesthesia Type: General Level of consciousness: awake and alert Pain management: pain level controlled Vital Signs Assessment: post-procedure vital signs reviewed and stable Respiratory status: spontaneous breathing, nonlabored ventilation, respiratory function stable and patient connected to nasal cannula oxygen Cardiovascular status: blood pressure returned to baseline and stable Postop Assessment: no signs of nausea or vomiting Anesthetic complications: no       Last Vitals:  Vitals:   04/02/16 1401 04/02/16 1415  BP: (!) 151/69 (!) 151/74  Pulse: 67 67  Resp: 11 14  Temp:      Last Pain:  Vitals:   04/02/16 1152  TempSrc: Oral                 Javad Salva DAVID

## 2016-04-02 NOTE — Discharge Instructions (Signed)

## 2016-04-02 NOTE — Anesthesia Procedure Notes (Signed)
Procedure Name: LMA Insertion Date/Time: 04/02/2016 1:07 PM Performed by: Adyn Hoes D Pre-anesthesia Checklist: Patient identified, Emergency Drugs available, Suction available and Patient being monitored Patient Re-evaluated:Patient Re-evaluated prior to inductionOxygen Delivery Method: Circle system utilized Preoxygenation: Pre-oxygenation with 100% oxygen Intubation Type: IV induction Ventilation: Mask ventilation without difficulty LMA: LMA inserted LMA Size: 4.0 Number of attempts: 1 Airway Equipment and Method: Bite block Placement Confirmation: positive ETCO2 Tube secured with: Tape Dental Injury: Teeth and Oropharynx as per pre-operative assessment

## 2016-04-02 NOTE — Anesthesia Preprocedure Evaluation (Signed)
Anesthesia Evaluation  Patient identified by MRN, date of birth, ID band Patient awake    Reviewed: Allergy & Precautions, NPO status , Patient's Chart, lab work & pertinent test results  Airway Mallampati: I  TM Distance: >3 FB Neck ROM: Full    Dental   Pulmonary former smoker,    Pulmonary exam normal        Cardiovascular hypertension, Pt. on medications Normal cardiovascular exam     Neuro/Psych    GI/Hepatic   Endo/Other    Renal/GU      Musculoskeletal   Abdominal   Peds  Hematology   Anesthesia Other Findings   Reproductive/Obstetrics                            Anesthesia Physical Anesthesia Plan  ASA: II  Anesthesia Plan: General   Post-op Pain Management:    Induction: Intravenous  Airway Management Planned: LMA  Additional Equipment:   Intra-op Plan:   Post-operative Plan: Extubation in OR  Informed Consent: I have reviewed the patients History and Physical, chart, labs and discussed the procedure including the risks, benefits and alternatives for the proposed anesthesia with the patient or authorized representative who has indicated his/her understanding and acceptance.     Plan Discussed with: CRNA and Surgeon  Anesthesia Plan Comments:        Anesthesia Quick Evaluation  

## 2016-04-02 NOTE — Interval H&P Note (Signed)
History and Physical Interval Note:  04/02/2016 12:57 PM  Natasha Hill  has presented today for surgery, with the diagnosis of [place] . The various methods of treatment have been discussed with the patient and family. After consideration of risks, benefits and other options for treatment, the patient has consented to    Procedure(s): RIGHT BREAST EXCISIONAL BIOPSY WITH NEEDLE LOCALIZATION (Right) as a surgical intervention .    The patient's history has been reviewed, patient examined, no change in status, stable for surgery.  I have reviewed the patient's chart and labs.  Questions were answered to the patient's satisfaction.    Earnstine Regal, MD, [facility] Office: [place]

## 2016-04-03 ENCOUNTER — Encounter (HOSPITAL_BASED_OUTPATIENT_CLINIC_OR_DEPARTMENT_OTHER): Payer: Self-pay | Admitting: Surgery

## 2016-04-03 NOTE — Op Note (Signed)
Natasha Hill, Natasha Hill                 ACCOUNT NO.:  [id number]  MEDICAL RECORD NO.:  [id number]  LOCATION:                                 FACILITY:  PHYSICIAN:  Earnstine Regal, MD      DATE OF BIRTH:  [birth year]  DATE OF PROCEDURE:  04/02/2016                              OPERATIVE REPORT   PREOPERATIVE DIAGNOSIS:  Atypical ductal hyperplasia and intraductal papilloma, right breast.  POSTOPERATIVE DIAGNOSIS:  Atypical ductal hyperplasia and intraductal papilloma, right breast.  PROCEDURE:  Right breast excisional biopsy with wire localization.  SURGEON:  Earnstine Regal, MD  ANESTHESIA:  General per Dr. Lillia Abed.  ESTIMATED BLOOD LOSS:  Minimal.  PREPARATION:  ChloraPrep.  COMPLICATIONS:  None.  INDICATIONS:  The patient is a 75 year old female referred by her primary care physician, Dr. Dirk Dress in [place] for evaluation and management of atypical ductal hyperplasia and intraductal papilloma of the right breast.  The patient had undergone a screening mammogram and found to have an abnormality in the right breast.  Needle biopsy showed atypical ductal hyperplasia and intraductal papilloma.  The patient now comes to Surgery for excision.  BODY OF REPORT:  The patient had previously gone to the [facility] where a wire localization had been performed.  The patient came to the [facility].  She was prepared and brought to the operating room and placed in a supine position on the operating room table.  Following administration of general anesthesia, the previously- placed dressings on the right breast were removed and the wire shortened.  Breast was then prepped and draped in the usual aseptic fashion.  After ascertaining that an adequate level of anesthesia had been achieved, an incision was made at the site of guidewire insertion in the lower outer quadrant of the right breast.  Dissection was carried through the subcutaneous tissues.   Using the electrocautery for hemostasis, a core of breast tissue was excised around the guidewire. Dissection was carried to the tip of the wire.  The entire specimen with the wire in place was removed and a specimen mammogram was obtained, which confirmed that the marker for the lesion in question was within the specimen.  Specimen was oriented with ink according to the usual protocol.  It was submitted to Pathology for review.  Wound was irrigated with warm saline.  Good hemostasis was noted. Subcutaneous tissues were closed with interrupted 3-0 Vicryl sutures. Skin was closed with a running 4-0 Monocryl subcuticular suture.  Wound was washed and dried, and Dermabond was applied as dressing.  The patient was awakened from anesthesia and brought to the recovery room. The patient tolerated the procedure well.   Earnstine Regal, MD, [facility] Office: [phone]    TMG/MEDQ  D:  04/02/2016  T:  04/03/2016  Job:  [id number]  cc:   Dirk Dress, MD

## 2016-04-04 NOTE — Progress Notes (Signed)
Please contact patient and notify of benign pathology results.  Wilkie Zenon M. Dakayla Disanti, MD, FACS [facility] Office: [phone]   

## 2016-07-26 DIAGNOSIS — E785 Hyperlipidemia, unspecified: Secondary | ICD-10-CM

## 2016-07-26 DIAGNOSIS — J90 Pleural effusion, not elsewhere classified: Secondary | ICD-10-CM

## 2016-07-26 DIAGNOSIS — D649 Anemia, unspecified: Secondary | ICD-10-CM | POA: Diagnosis not present

## 2016-07-26 DIAGNOSIS — R140 Abdominal distension (gaseous): Secondary | ICD-10-CM | POA: Diagnosis not present

## 2016-07-26 DIAGNOSIS — N179 Acute kidney failure, unspecified: Secondary | ICD-10-CM | POA: Diagnosis not present

## 2016-07-26 DIAGNOSIS — R634 Abnormal weight loss: Secondary | ICD-10-CM

## 2016-07-26 DIAGNOSIS — R531 Weakness: Secondary | ICD-10-CM

## 2016-07-26 DIAGNOSIS — I10 Essential (primary) hypertension: Secondary | ICD-10-CM | POA: Diagnosis not present

## 2016-07-26 DIAGNOSIS — Z720 Tobacco use: Secondary | ICD-10-CM | POA: Diagnosis not present

## 2016-07-26 DIAGNOSIS — R1900 Intra-abdominal and pelvic swelling, mass and lump, unspecified site: Secondary | ICD-10-CM | POA: Diagnosis not present

## 2016-07-27 DIAGNOSIS — N179 Acute kidney failure, unspecified: Secondary | ICD-10-CM | POA: Diagnosis not present

## 2016-07-27 DIAGNOSIS — J90 Pleural effusion, not elsewhere classified: Secondary | ICD-10-CM | POA: Diagnosis not present

## 2016-07-27 DIAGNOSIS — D649 Anemia, unspecified: Secondary | ICD-10-CM

## 2016-07-27 DIAGNOSIS — R531 Weakness: Secondary | ICD-10-CM | POA: Diagnosis not present

## 2016-07-27 DIAGNOSIS — R634 Abnormal weight loss: Secondary | ICD-10-CM | POA: Diagnosis not present

## 2016-07-28 DIAGNOSIS — R531 Weakness: Secondary | ICD-10-CM | POA: Diagnosis not present

## 2016-07-28 DIAGNOSIS — N179 Acute kidney failure, unspecified: Secondary | ICD-10-CM | POA: Diagnosis not present

## 2016-07-28 DIAGNOSIS — J90 Pleural effusion, not elsewhere classified: Secondary | ICD-10-CM | POA: Diagnosis not present

## 2016-07-28 DIAGNOSIS — R634 Abnormal weight loss: Secondary | ICD-10-CM | POA: Diagnosis not present

## 2016-07-29 DIAGNOSIS — R634 Abnormal weight loss: Secondary | ICD-10-CM | POA: Diagnosis not present

## 2016-07-29 DIAGNOSIS — R531 Weakness: Secondary | ICD-10-CM | POA: Diagnosis not present

## 2016-07-29 DIAGNOSIS — R1900 Intra-abdominal and pelvic swelling, mass and lump, unspecified site: Secondary | ICD-10-CM

## 2016-07-29 DIAGNOSIS — N179 Acute kidney failure, unspecified: Secondary | ICD-10-CM | POA: Diagnosis not present

## 2016-07-29 DIAGNOSIS — J90 Pleural effusion, not elsewhere classified: Secondary | ICD-10-CM | POA: Diagnosis not present

## 2016-07-30 DIAGNOSIS — R531 Weakness: Secondary | ICD-10-CM | POA: Diagnosis not present

## 2016-07-30 DIAGNOSIS — R634 Abnormal weight loss: Secondary | ICD-10-CM | POA: Diagnosis not present

## 2016-07-30 DIAGNOSIS — N179 Acute kidney failure, unspecified: Secondary | ICD-10-CM | POA: Diagnosis not present

## 2016-07-30 DIAGNOSIS — R140 Abdominal distension (gaseous): Secondary | ICD-10-CM | POA: Diagnosis not present

## 2016-07-30 DIAGNOSIS — I10 Essential (primary) hypertension: Secondary | ICD-10-CM | POA: Diagnosis not present

## 2016-07-30 DIAGNOSIS — R1900 Intra-abdominal and pelvic swelling, mass and lump, unspecified site: Secondary | ICD-10-CM | POA: Diagnosis not present

## 2016-07-30 DIAGNOSIS — R188 Other ascites: Secondary | ICD-10-CM

## 2016-09-19 DIAGNOSIS — C569 Malignant neoplasm of unspecified ovary: Secondary | ICD-10-CM | POA: Diagnosis not present

## 2016-09-23 ENCOUNTER — Other Ambulatory Visit: Payer: Self-pay | Admitting: Surgery

## 2016-09-23 DIAGNOSIS — R5381 Other malaise: Secondary | ICD-10-CM

## 2016-09-24 ENCOUNTER — Other Ambulatory Visit: Payer: Self-pay | Admitting: Surgery

## 2016-09-24 DIAGNOSIS — D241 Benign neoplasm of right breast: Secondary | ICD-10-CM

## 2016-10-17 DIAGNOSIS — E860 Dehydration: Secondary | ICD-10-CM

## 2016-10-17 DIAGNOSIS — C569 Malignant neoplasm of unspecified ovary: Secondary | ICD-10-CM

## 2016-11-07 DIAGNOSIS — C569 Malignant neoplasm of unspecified ovary: Secondary | ICD-10-CM | POA: Diagnosis not present

## 2016-11-07 DIAGNOSIS — R7989 Other specified abnormal findings of blood chemistry: Secondary | ICD-10-CM | POA: Diagnosis not present

## 2016-11-28 DIAGNOSIS — L659 Nonscarring hair loss, unspecified: Secondary | ICD-10-CM | POA: Diagnosis not present

## 2016-11-28 DIAGNOSIS — R7989 Other specified abnormal findings of blood chemistry: Secondary | ICD-10-CM | POA: Diagnosis not present

## 2016-11-28 DIAGNOSIS — C569 Malignant neoplasm of unspecified ovary: Secondary | ICD-10-CM | POA: Diagnosis not present

## 2016-12-17 DIAGNOSIS — C569 Malignant neoplasm of unspecified ovary: Secondary | ICD-10-CM | POA: Diagnosis not present

## 2017-01-04 DIAGNOSIS — D649 Anemia, unspecified: Secondary | ICD-10-CM | POA: Diagnosis not present

## 2017-01-04 DIAGNOSIS — I10 Essential (primary) hypertension: Secondary | ICD-10-CM

## 2017-01-04 DIAGNOSIS — N289 Disorder of kidney and ureter, unspecified: Secondary | ICD-10-CM | POA: Diagnosis not present

## 2017-01-04 DIAGNOSIS — K1121 Acute sialoadenitis: Secondary | ICD-10-CM | POA: Diagnosis not present

## 2017-01-05 DIAGNOSIS — K1121 Acute sialoadenitis: Secondary | ICD-10-CM | POA: Diagnosis not present

## 2017-01-05 DIAGNOSIS — I10 Essential (primary) hypertension: Secondary | ICD-10-CM | POA: Diagnosis not present

## 2017-01-05 DIAGNOSIS — D649 Anemia, unspecified: Secondary | ICD-10-CM | POA: Diagnosis not present

## 2017-01-05 DIAGNOSIS — N289 Disorder of kidney and ureter, unspecified: Secondary | ICD-10-CM | POA: Diagnosis not present

## 2017-01-06 DIAGNOSIS — K1121 Acute sialoadenitis: Secondary | ICD-10-CM | POA: Diagnosis not present

## 2017-01-06 DIAGNOSIS — N289 Disorder of kidney and ureter, unspecified: Secondary | ICD-10-CM | POA: Diagnosis not present

## 2017-01-06 DIAGNOSIS — I10 Essential (primary) hypertension: Secondary | ICD-10-CM | POA: Diagnosis not present

## 2017-01-06 DIAGNOSIS — D649 Anemia, unspecified: Secondary | ICD-10-CM | POA: Diagnosis not present

## 2017-01-07 DIAGNOSIS — I951 Orthostatic hypotension: Secondary | ICD-10-CM | POA: Diagnosis not present

## 2017-01-07 DIAGNOSIS — I10 Essential (primary) hypertension: Secondary | ICD-10-CM | POA: Diagnosis not present

## 2017-01-07 DIAGNOSIS — C569 Malignant neoplasm of unspecified ovary: Secondary | ICD-10-CM | POA: Diagnosis not present

## 2017-02-11 DIAGNOSIS — C569 Malignant neoplasm of unspecified ovary: Secondary | ICD-10-CM

## 2017-02-11 DIAGNOSIS — D6481 Anemia due to antineoplastic chemotherapy: Secondary | ICD-10-CM

## 2017-02-11 DIAGNOSIS — R531 Weakness: Secondary | ICD-10-CM

## 2017-04-09 DIAGNOSIS — D225 Melanocytic nevi of trunk: Secondary | ICD-10-CM | POA: Diagnosis not present

## 2017-04-09 DIAGNOSIS — M7541 Impingement syndrome of right shoulder: Secondary | ICD-10-CM | POA: Diagnosis not present

## 2017-04-09 DIAGNOSIS — D2261 Melanocytic nevi of right upper limb, including shoulder: Secondary | ICD-10-CM | POA: Diagnosis not present

## 2017-04-15 DIAGNOSIS — D485 Neoplasm of uncertain behavior of skin: Secondary | ICD-10-CM | POA: Diagnosis not present

## 2017-04-15 DIAGNOSIS — D2262 Melanocytic nevi of left upper limb, including shoulder: Secondary | ICD-10-CM | POA: Diagnosis not present

## 2017-04-15 DIAGNOSIS — L578 Other skin changes due to chronic exposure to nonionizing radiation: Secondary | ICD-10-CM | POA: Diagnosis not present

## 2017-04-15 DIAGNOSIS — D225 Melanocytic nevi of trunk: Secondary | ICD-10-CM | POA: Diagnosis not present

## 2017-05-07 DIAGNOSIS — J020 Streptococcal pharyngitis: Secondary | ICD-10-CM | POA: Diagnosis not present

## 2017-05-14 DIAGNOSIS — C561 Malignant neoplasm of right ovary: Secondary | ICD-10-CM | POA: Diagnosis not present

## 2017-05-14 DIAGNOSIS — C562 Malignant neoplasm of left ovary: Secondary | ICD-10-CM | POA: Diagnosis not present

## 2017-05-28 DIAGNOSIS — Z01810 Encounter for preprocedural cardiovascular examination: Secondary | ICD-10-CM | POA: Diagnosis not present

## 2017-05-28 DIAGNOSIS — C569 Malignant neoplasm of unspecified ovary: Secondary | ICD-10-CM | POA: Diagnosis not present

## 2017-05-28 DIAGNOSIS — I444 Left anterior fascicular block: Secondary | ICD-10-CM | POA: Diagnosis not present

## 2017-05-28 DIAGNOSIS — I4510 Unspecified right bundle-branch block: Secondary | ICD-10-CM | POA: Diagnosis not present

## 2017-05-28 DIAGNOSIS — Z433 Encounter for attention to colostomy: Secondary | ICD-10-CM | POA: Diagnosis not present

## 2017-05-28 DIAGNOSIS — Z87891 Personal history of nicotine dependence: Secondary | ICD-10-CM | POA: Diagnosis not present

## 2017-05-28 DIAGNOSIS — E785 Hyperlipidemia, unspecified: Secondary | ICD-10-CM | POA: Diagnosis not present

## 2017-05-28 DIAGNOSIS — I10 Essential (primary) hypertension: Secondary | ICD-10-CM | POA: Diagnosis not present

## 2017-05-28 DIAGNOSIS — I517 Cardiomegaly: Secondary | ICD-10-CM | POA: Diagnosis not present

## 2017-05-28 DIAGNOSIS — Z01818 Encounter for other preprocedural examination: Secondary | ICD-10-CM | POA: Diagnosis not present

## 2017-05-28 DIAGNOSIS — Z538 Procedure and treatment not carried out for other reasons: Secondary | ICD-10-CM | POA: Diagnosis not present

## 2017-05-28 DIAGNOSIS — I452 Bifascicular block: Secondary | ICD-10-CM | POA: Diagnosis not present

## 2017-06-10 DIAGNOSIS — C561 Malignant neoplasm of right ovary: Secondary | ICD-10-CM | POA: Diagnosis not present

## 2017-06-10 DIAGNOSIS — C562 Malignant neoplasm of left ovary: Secondary | ICD-10-CM | POA: Diagnosis not present

## 2017-06-10 DIAGNOSIS — C7982 Secondary malignant neoplasm of genital organs: Secondary | ICD-10-CM | POA: Diagnosis not present

## 2017-06-10 DIAGNOSIS — D569 Thalassemia, unspecified: Secondary | ICD-10-CM | POA: Diagnosis not present

## 2017-06-10 DIAGNOSIS — C786 Secondary malignant neoplasm of retroperitoneum and peritoneum: Secondary | ICD-10-CM | POA: Diagnosis not present

## 2017-06-11 DIAGNOSIS — C786 Secondary malignant neoplasm of retroperitoneum and peritoneum: Secondary | ICD-10-CM | POA: Diagnosis not present

## 2017-06-11 DIAGNOSIS — C7982 Secondary malignant neoplasm of genital organs: Secondary | ICD-10-CM | POA: Diagnosis not present

## 2017-06-11 DIAGNOSIS — Z933 Colostomy status: Secondary | ICD-10-CM | POA: Diagnosis not present

## 2017-06-11 DIAGNOSIS — Z9221 Personal history of antineoplastic chemotherapy: Secondary | ICD-10-CM | POA: Diagnosis not present

## 2017-06-11 DIAGNOSIS — D569 Thalassemia, unspecified: Secondary | ICD-10-CM | POA: Diagnosis not present

## 2017-06-11 DIAGNOSIS — C561 Malignant neoplasm of right ovary: Secondary | ICD-10-CM | POA: Diagnosis not present

## 2017-06-11 DIAGNOSIS — C562 Malignant neoplasm of left ovary: Secondary | ICD-10-CM | POA: Diagnosis not present

## 2017-06-11 DIAGNOSIS — I10 Essential (primary) hypertension: Secondary | ICD-10-CM | POA: Diagnosis not present

## 2017-06-11 DIAGNOSIS — C569 Malignant neoplasm of unspecified ovary: Secondary | ICD-10-CM | POA: Diagnosis not present

## 2017-06-12 ENCOUNTER — Other Ambulatory Visit: Payer: Self-pay | Admitting: *Deleted

## 2017-06-12 ENCOUNTER — Encounter: Payer: Self-pay | Admitting: *Deleted

## 2017-06-12 NOTE — Patient Outreach (Signed)
[facility]) Care Management  06/12/2017  Natasha Hill [birth year] [id number]  Referral via [place]; member discharged from inpatient admission from [facility] 05/30/2017.  Per [facility]; PCP-Dr, Rochel Brome Admission 2/12-2/23/2019  Telephone call to patient; left message on voice mail requesting call back.  Plan: Geophysicist/field seismologist. Will follow up next week.   Sherrin Daisy, RN BSN [facility] Management Coordinator [facility] Care Management  [phone]

## 2017-06-15 ENCOUNTER — Other Ambulatory Visit: Payer: Self-pay | Admitting: *Deleted

## 2017-06-15 NOTE — Patient Outreach (Signed)
[place] [facility]) Care Management  06/15/2017  Areyanna Figeroa [birth year] [id number]  Telephone call attempt x 2. Spoke with grand daughter who voices she is patient's caregiver. States she takes all of patient's calls about her health concerns. Caregiver states she has laryngitis and will not be able to talk to this care coordinator further.  States better to call back end of week when her voice is back.  Plan: Call back end of week as requested. Appointment set with caregiver agreement.  Sherrin Daisy, RN BSN [place] Management Coordinator [facility] Care Management  [phone]

## 2017-06-16 DIAGNOSIS — I10 Essential (primary) hypertension: Secondary | ICD-10-CM | POA: Diagnosis not present

## 2017-06-16 DIAGNOSIS — K660 Peritoneal adhesions (postprocedural) (postinfection): Secondary | ICD-10-CM | POA: Diagnosis not present

## 2017-06-16 DIAGNOSIS — J439 Emphysema, unspecified: Secondary | ICD-10-CM | POA: Diagnosis not present

## 2017-06-16 DIAGNOSIS — N1330 Unspecified hydronephrosis: Secondary | ICD-10-CM | POA: Diagnosis not present

## 2017-06-16 DIAGNOSIS — C562 Malignant neoplasm of left ovary: Secondary | ICD-10-CM | POA: Diagnosis not present

## 2017-06-16 DIAGNOSIS — I110 Hypertensive heart disease with heart failure: Secondary | ICD-10-CM | POA: Diagnosis not present

## 2017-06-16 DIAGNOSIS — R918 Other nonspecific abnormal finding of lung field: Secondary | ICD-10-CM | POA: Diagnosis not present

## 2017-06-16 DIAGNOSIS — R7989 Other specified abnormal findings of blood chemistry: Secondary | ICD-10-CM | POA: Diagnosis not present

## 2017-06-16 DIAGNOSIS — J9811 Atelectasis: Secondary | ICD-10-CM | POA: Diagnosis not present

## 2017-06-16 DIAGNOSIS — R000 Tachycardia, unspecified: Secondary | ICD-10-CM | POA: Diagnosis not present

## 2017-06-16 DIAGNOSIS — E872 Acidosis: Secondary | ICD-10-CM | POA: Diagnosis not present

## 2017-06-16 DIAGNOSIS — D649 Anemia, unspecified: Secondary | ICD-10-CM | POA: Diagnosis not present

## 2017-06-16 DIAGNOSIS — I5033 Acute on chronic diastolic (congestive) heart failure: Secondary | ICD-10-CM | POA: Diagnosis not present

## 2017-06-16 DIAGNOSIS — R8569 Abnormal cytological findings in specimens from other digestive organs and abdominal cavity: Secondary | ICD-10-CM | POA: Diagnosis not present

## 2017-06-16 DIAGNOSIS — I160 Hypertensive urgency: Secondary | ICD-10-CM | POA: Diagnosis not present

## 2017-06-16 DIAGNOSIS — Z9889 Other specified postprocedural states: Secondary | ICD-10-CM | POA: Diagnosis not present

## 2017-06-16 DIAGNOSIS — N135 Crossing vessel and stricture of ureter without hydronephrosis: Secondary | ICD-10-CM | POA: Diagnosis not present

## 2017-06-16 DIAGNOSIS — G8918 Other acute postprocedural pain: Secondary | ICD-10-CM | POA: Diagnosis not present

## 2017-06-16 DIAGNOSIS — C569 Malignant neoplasm of unspecified ovary: Secondary | ICD-10-CM | POA: Diagnosis not present

## 2017-06-16 DIAGNOSIS — Z9221 Personal history of antineoplastic chemotherapy: Secondary | ICD-10-CM | POA: Diagnosis not present

## 2017-06-16 DIAGNOSIS — R1907 Generalized intra-abdominal and pelvic swelling, mass and lump: Secondary | ICD-10-CM | POA: Diagnosis not present

## 2017-06-16 DIAGNOSIS — I2510 Atherosclerotic heart disease of native coronary artery without angina pectoris: Secondary | ICD-10-CM | POA: Diagnosis not present

## 2017-06-16 DIAGNOSIS — I4510 Unspecified right bundle-branch block: Secondary | ICD-10-CM | POA: Diagnosis not present

## 2017-06-16 DIAGNOSIS — R9431 Abnormal electrocardiogram [ECG] [EKG]: Secondary | ICD-10-CM | POA: Diagnosis not present

## 2017-06-16 DIAGNOSIS — N179 Acute kidney failure, unspecified: Secondary | ICD-10-CM | POA: Diagnosis not present

## 2017-06-16 DIAGNOSIS — J90 Pleural effusion, not elsewhere classified: Secondary | ICD-10-CM | POA: Diagnosis not present

## 2017-06-16 DIAGNOSIS — I248 Other forms of acute ischemic heart disease: Secondary | ICD-10-CM | POA: Diagnosis not present

## 2017-06-16 DIAGNOSIS — J9601 Acute respiratory failure with hypoxia: Secondary | ICD-10-CM | POA: Diagnosis not present

## 2017-06-16 DIAGNOSIS — E874 Mixed disorder of acid-base balance: Secondary | ICD-10-CM | POA: Diagnosis not present

## 2017-06-16 DIAGNOSIS — R1900 Intra-abdominal and pelvic swelling, mass and lump, unspecified site: Secondary | ICD-10-CM | POA: Diagnosis not present

## 2017-06-16 DIAGNOSIS — I340 Nonrheumatic mitral (valve) insufficiency: Secondary | ICD-10-CM | POA: Diagnosis not present

## 2017-06-16 DIAGNOSIS — E869 Volume depletion, unspecified: Secondary | ICD-10-CM | POA: Diagnosis not present

## 2017-06-16 DIAGNOSIS — D62 Acute posthemorrhagic anemia: Secondary | ICD-10-CM | POA: Diagnosis not present

## 2017-06-16 DIAGNOSIS — N170 Acute kidney failure with tubular necrosis: Secondary | ICD-10-CM | POA: Diagnosis not present

## 2017-06-16 DIAGNOSIS — C561 Malignant neoplasm of right ovary: Secondary | ICD-10-CM | POA: Diagnosis not present

## 2017-06-16 DIAGNOSIS — E8770 Fluid overload, unspecified: Secondary | ICD-10-CM | POA: Diagnosis not present

## 2017-06-16 DIAGNOSIS — I358 Other nonrheumatic aortic valve disorders: Secondary | ICD-10-CM | POA: Diagnosis not present

## 2017-06-16 DIAGNOSIS — I9788 Other intraoperative complications of the circulatory system, not elsewhere classified: Secondary | ICD-10-CM | POA: Diagnosis not present

## 2017-06-16 DIAGNOSIS — I517 Cardiomegaly: Secondary | ICD-10-CM | POA: Diagnosis not present

## 2017-06-16 DIAGNOSIS — R109 Unspecified abdominal pain: Secondary | ICD-10-CM | POA: Diagnosis not present

## 2017-06-16 DIAGNOSIS — C786 Secondary malignant neoplasm of retroperitoneum and peritoneum: Secondary | ICD-10-CM | POA: Diagnosis not present

## 2017-06-16 DIAGNOSIS — J810 Acute pulmonary edema: Secondary | ICD-10-CM | POA: Diagnosis not present

## 2017-06-16 DIAGNOSIS — I509 Heart failure, unspecified: Secondary | ICD-10-CM | POA: Diagnosis not present

## 2017-06-16 DIAGNOSIS — I9589 Other hypotension: Secondary | ICD-10-CM | POA: Diagnosis not present

## 2017-06-16 DIAGNOSIS — E785 Hyperlipidemia, unspecified: Secondary | ICD-10-CM | POA: Diagnosis not present

## 2017-06-19 ENCOUNTER — Ambulatory Visit: Payer: Self-pay | Admitting: *Deleted

## 2017-06-22 ENCOUNTER — Other Ambulatory Visit: Payer: Self-pay | Admitting: *Deleted

## 2017-06-22 NOTE — Patient Outreach (Signed)
[place] [facility]) Care Management  06/22/2017  Natasha Hill 1940/05/21 [id number]  Referral via [place]; member discharged from inpatient admission from [facility] 05/30/2017.  Per [facility]; PCP-Dr, Rochel Brome Admission 2/12-2/23/2019  Telephone call attempt x 3; left HIPPA compliant voice mail.  Plan: Outreach letter 3/8. Will follow up.  Sherrin Daisy, RN BSN [place] Management Coordinator [facility] Care Management  [phone]

## 2017-06-25 ENCOUNTER — Other Ambulatory Visit: Payer: Self-pay | Admitting: *Deleted

## 2017-06-25 NOTE — Patient Outreach (Signed)
[facility]) Care Management  06/25/2017  Daria Mcmeekin 05-29-1940 [id number]  Telephone call to patient x 3; left message on voice mail. No response to outreach letter.  Plan: Case closure.  Sherrin Daisy, RN BSN [place] Management Coordinator [facility] Care Management  [phone]

## 2017-06-26 ENCOUNTER — Ambulatory Visit: Payer: Self-pay | Admitting: *Deleted

## 2017-07-03 DIAGNOSIS — Z7982 Long term (current) use of aspirin: Secondary | ICD-10-CM | POA: Diagnosis not present

## 2017-07-03 DIAGNOSIS — Z9181 History of falling: Secondary | ICD-10-CM | POA: Diagnosis not present

## 2017-07-03 DIAGNOSIS — F419 Anxiety disorder, unspecified: Secondary | ICD-10-CM | POA: Diagnosis not present

## 2017-07-03 DIAGNOSIS — C562 Malignant neoplasm of left ovary: Secondary | ICD-10-CM | POA: Diagnosis not present

## 2017-07-03 DIAGNOSIS — Z87891 Personal history of nicotine dependence: Secondary | ICD-10-CM | POA: Diagnosis not present

## 2017-07-03 DIAGNOSIS — Z452 Encounter for adjustment and management of vascular access device: Secondary | ICD-10-CM | POA: Diagnosis not present

## 2017-07-03 DIAGNOSIS — Z483 Aftercare following surgery for neoplasm: Secondary | ICD-10-CM | POA: Diagnosis not present

## 2017-07-03 DIAGNOSIS — C561 Malignant neoplasm of right ovary: Secondary | ICD-10-CM | POA: Diagnosis not present

## 2017-07-03 DIAGNOSIS — I10 Essential (primary) hypertension: Secondary | ICD-10-CM | POA: Diagnosis not present

## 2017-07-04 DIAGNOSIS — F419 Anxiety disorder, unspecified: Secondary | ICD-10-CM | POA: Diagnosis not present

## 2017-07-04 DIAGNOSIS — C562 Malignant neoplasm of left ovary: Secondary | ICD-10-CM | POA: Diagnosis not present

## 2017-07-04 DIAGNOSIS — Z7982 Long term (current) use of aspirin: Secondary | ICD-10-CM | POA: Diagnosis not present

## 2017-07-04 DIAGNOSIS — C561 Malignant neoplasm of right ovary: Secondary | ICD-10-CM | POA: Diagnosis not present

## 2017-07-04 DIAGNOSIS — I10 Essential (primary) hypertension: Secondary | ICD-10-CM | POA: Diagnosis not present

## 2017-07-04 DIAGNOSIS — Z452 Encounter for adjustment and management of vascular access device: Secondary | ICD-10-CM | POA: Diagnosis not present

## 2017-07-04 DIAGNOSIS — Z9181 History of falling: Secondary | ICD-10-CM | POA: Diagnosis not present

## 2017-07-04 DIAGNOSIS — Z483 Aftercare following surgery for neoplasm: Secondary | ICD-10-CM | POA: Diagnosis not present

## 2017-07-04 DIAGNOSIS — Z87891 Personal history of nicotine dependence: Secondary | ICD-10-CM | POA: Diagnosis not present

## 2017-07-08 DIAGNOSIS — Z9181 History of falling: Secondary | ICD-10-CM | POA: Diagnosis not present

## 2017-07-08 DIAGNOSIS — I10 Essential (primary) hypertension: Secondary | ICD-10-CM | POA: Diagnosis not present

## 2017-07-08 DIAGNOSIS — R748 Abnormal levels of other serum enzymes: Secondary | ICD-10-CM | POA: Diagnosis not present

## 2017-07-08 DIAGNOSIS — Z452 Encounter for adjustment and management of vascular access device: Secondary | ICD-10-CM | POA: Diagnosis not present

## 2017-07-08 DIAGNOSIS — K5903 Drug induced constipation: Secondary | ICD-10-CM | POA: Diagnosis not present

## 2017-07-08 DIAGNOSIS — D508 Other iron deficiency anemias: Secondary | ICD-10-CM | POA: Diagnosis not present

## 2017-07-08 DIAGNOSIS — C785 Secondary malignant neoplasm of large intestine and rectum: Secondary | ICD-10-CM | POA: Diagnosis not present

## 2017-07-08 DIAGNOSIS — Z7982 Long term (current) use of aspirin: Secondary | ICD-10-CM | POA: Diagnosis not present

## 2017-07-08 DIAGNOSIS — J95821 Acute postprocedural respiratory failure: Secondary | ICD-10-CM | POA: Diagnosis not present

## 2017-07-08 DIAGNOSIS — F419 Anxiety disorder, unspecified: Secondary | ICD-10-CM | POA: Diagnosis not present

## 2017-07-08 DIAGNOSIS — Z87891 Personal history of nicotine dependence: Secondary | ICD-10-CM | POA: Diagnosis not present

## 2017-07-08 DIAGNOSIS — N178 Other acute kidney failure: Secondary | ICD-10-CM | POA: Diagnosis not present

## 2017-07-08 DIAGNOSIS — C561 Malignant neoplasm of right ovary: Secondary | ICD-10-CM | POA: Diagnosis not present

## 2017-07-08 DIAGNOSIS — E782 Mixed hyperlipidemia: Secondary | ICD-10-CM | POA: Diagnosis not present

## 2017-07-08 DIAGNOSIS — Z483 Aftercare following surgery for neoplasm: Secondary | ICD-10-CM | POA: Diagnosis not present

## 2017-07-08 DIAGNOSIS — C562 Malignant neoplasm of left ovary: Secondary | ICD-10-CM | POA: Diagnosis not present

## 2017-07-09 DIAGNOSIS — C561 Malignant neoplasm of right ovary: Secondary | ICD-10-CM | POA: Diagnosis not present

## 2017-07-09 DIAGNOSIS — Z483 Aftercare following surgery for neoplasm: Secondary | ICD-10-CM | POA: Diagnosis not present

## 2017-07-09 DIAGNOSIS — N390 Urinary tract infection, site not specified: Secondary | ICD-10-CM | POA: Diagnosis not present

## 2017-07-09 DIAGNOSIS — C562 Malignant neoplasm of left ovary: Secondary | ICD-10-CM | POA: Diagnosis not present

## 2017-07-10 DIAGNOSIS — Z9181 History of falling: Secondary | ICD-10-CM | POA: Diagnosis not present

## 2017-07-10 DIAGNOSIS — Z483 Aftercare following surgery for neoplasm: Secondary | ICD-10-CM | POA: Diagnosis not present

## 2017-07-10 DIAGNOSIS — Z87891 Personal history of nicotine dependence: Secondary | ICD-10-CM | POA: Diagnosis not present

## 2017-07-10 DIAGNOSIS — Z452 Encounter for adjustment and management of vascular access device: Secondary | ICD-10-CM | POA: Diagnosis not present

## 2017-07-10 DIAGNOSIS — C561 Malignant neoplasm of right ovary: Secondary | ICD-10-CM | POA: Diagnosis not present

## 2017-07-10 DIAGNOSIS — C562 Malignant neoplasm of left ovary: Secondary | ICD-10-CM | POA: Diagnosis not present

## 2017-07-10 DIAGNOSIS — F419 Anxiety disorder, unspecified: Secondary | ICD-10-CM | POA: Diagnosis not present

## 2017-07-10 DIAGNOSIS — I10 Essential (primary) hypertension: Secondary | ICD-10-CM | POA: Diagnosis not present

## 2017-07-10 DIAGNOSIS — Z7982 Long term (current) use of aspirin: Secondary | ICD-10-CM | POA: Diagnosis not present

## 2017-07-13 DIAGNOSIS — C562 Malignant neoplasm of left ovary: Secondary | ICD-10-CM | POA: Diagnosis not present

## 2017-07-13 DIAGNOSIS — C561 Malignant neoplasm of right ovary: Secondary | ICD-10-CM | POA: Diagnosis not present

## 2017-07-13 DIAGNOSIS — F419 Anxiety disorder, unspecified: Secondary | ICD-10-CM | POA: Diagnosis not present

## 2017-07-13 DIAGNOSIS — Z7982 Long term (current) use of aspirin: Secondary | ICD-10-CM | POA: Diagnosis not present

## 2017-07-13 DIAGNOSIS — Z9181 History of falling: Secondary | ICD-10-CM | POA: Diagnosis not present

## 2017-07-13 DIAGNOSIS — I10 Essential (primary) hypertension: Secondary | ICD-10-CM | POA: Diagnosis not present

## 2017-07-13 DIAGNOSIS — Z452 Encounter for adjustment and management of vascular access device: Secondary | ICD-10-CM | POA: Diagnosis not present

## 2017-07-13 DIAGNOSIS — Z483 Aftercare following surgery for neoplasm: Secondary | ICD-10-CM | POA: Diagnosis not present

## 2017-07-13 DIAGNOSIS — Z87891 Personal history of nicotine dependence: Secondary | ICD-10-CM | POA: Diagnosis not present

## 2017-07-15 DIAGNOSIS — M25462 Effusion, left knee: Secondary | ICD-10-CM | POA: Diagnosis not present

## 2017-07-16 DIAGNOSIS — C562 Malignant neoplasm of left ovary: Secondary | ICD-10-CM | POA: Diagnosis not present

## 2017-07-16 DIAGNOSIS — Z7982 Long term (current) use of aspirin: Secondary | ICD-10-CM | POA: Diagnosis not present

## 2017-07-16 DIAGNOSIS — Z452 Encounter for adjustment and management of vascular access device: Secondary | ICD-10-CM | POA: Diagnosis not present

## 2017-07-16 DIAGNOSIS — F419 Anxiety disorder, unspecified: Secondary | ICD-10-CM | POA: Diagnosis not present

## 2017-07-16 DIAGNOSIS — I10 Essential (primary) hypertension: Secondary | ICD-10-CM | POA: Diagnosis not present

## 2017-07-16 DIAGNOSIS — Z9181 History of falling: Secondary | ICD-10-CM | POA: Diagnosis not present

## 2017-07-16 DIAGNOSIS — Z87891 Personal history of nicotine dependence: Secondary | ICD-10-CM | POA: Diagnosis not present

## 2017-07-16 DIAGNOSIS — Z483 Aftercare following surgery for neoplasm: Secondary | ICD-10-CM | POA: Diagnosis not present

## 2017-07-16 DIAGNOSIS — C561 Malignant neoplasm of right ovary: Secondary | ICD-10-CM | POA: Diagnosis not present

## 2017-07-20 DIAGNOSIS — M25462 Effusion, left knee: Secondary | ICD-10-CM | POA: Diagnosis not present

## 2017-07-20 DIAGNOSIS — S82152A Displaced fracture of left tibial tuberosity, initial encounter for closed fracture: Secondary | ICD-10-CM | POA: Diagnosis not present

## 2017-07-21 DIAGNOSIS — J90 Pleural effusion, not elsewhere classified: Secondary | ICD-10-CM | POA: Diagnosis not present

## 2017-07-21 DIAGNOSIS — S82115D Nondisplaced fracture of left tibial spine, subsequent encounter for closed fracture with routine healing: Secondary | ICD-10-CM | POA: Diagnosis not present

## 2017-07-21 DIAGNOSIS — C561 Malignant neoplasm of right ovary: Secondary | ICD-10-CM | POA: Diagnosis not present

## 2017-07-21 DIAGNOSIS — R05 Cough: Secondary | ICD-10-CM | POA: Diagnosis not present

## 2017-07-21 DIAGNOSIS — R634 Abnormal weight loss: Secondary | ICD-10-CM | POA: Diagnosis not present

## 2017-07-21 DIAGNOSIS — F321 Major depressive disorder, single episode, moderate: Secondary | ICD-10-CM | POA: Diagnosis not present

## 2017-07-23 DIAGNOSIS — R630 Anorexia: Secondary | ICD-10-CM | POA: Diagnosis not present

## 2017-07-23 DIAGNOSIS — Z48816 Encounter for surgical aftercare following surgery on the genitourinary system: Secondary | ICD-10-CM | POA: Diagnosis not present

## 2017-07-23 DIAGNOSIS — N390 Urinary tract infection, site not specified: Secondary | ICD-10-CM | POA: Diagnosis not present

## 2017-07-23 DIAGNOSIS — S82102A Unspecified fracture of upper end of left tibia, initial encounter for closed fracture: Secondary | ICD-10-CM | POA: Diagnosis not present

## 2017-07-23 DIAGNOSIS — C562 Malignant neoplasm of left ovary: Secondary | ICD-10-CM | POA: Diagnosis not present

## 2017-07-23 DIAGNOSIS — K5900 Constipation, unspecified: Secondary | ICD-10-CM | POA: Diagnosis not present

## 2017-07-23 DIAGNOSIS — F419 Anxiety disorder, unspecified: Secondary | ICD-10-CM | POA: Diagnosis not present

## 2017-07-24 DIAGNOSIS — Z452 Encounter for adjustment and management of vascular access device: Secondary | ICD-10-CM | POA: Diagnosis not present

## 2017-07-24 DIAGNOSIS — Z7982 Long term (current) use of aspirin: Secondary | ICD-10-CM | POA: Diagnosis not present

## 2017-07-24 DIAGNOSIS — I10 Essential (primary) hypertension: Secondary | ICD-10-CM | POA: Diagnosis not present

## 2017-07-24 DIAGNOSIS — Z9181 History of falling: Secondary | ICD-10-CM | POA: Diagnosis not present

## 2017-07-24 DIAGNOSIS — C561 Malignant neoplasm of right ovary: Secondary | ICD-10-CM | POA: Diagnosis not present

## 2017-07-24 DIAGNOSIS — Z483 Aftercare following surgery for neoplasm: Secondary | ICD-10-CM | POA: Diagnosis not present

## 2017-07-24 DIAGNOSIS — F419 Anxiety disorder, unspecified: Secondary | ICD-10-CM | POA: Diagnosis not present

## 2017-07-24 DIAGNOSIS — C562 Malignant neoplasm of left ovary: Secondary | ICD-10-CM | POA: Diagnosis not present

## 2017-07-24 DIAGNOSIS — Z87891 Personal history of nicotine dependence: Secondary | ICD-10-CM | POA: Diagnosis not present

## 2017-07-27 DIAGNOSIS — Z483 Aftercare following surgery for neoplasm: Secondary | ICD-10-CM | POA: Diagnosis not present

## 2017-07-27 DIAGNOSIS — Z7982 Long term (current) use of aspirin: Secondary | ICD-10-CM | POA: Diagnosis not present

## 2017-07-27 DIAGNOSIS — I10 Essential (primary) hypertension: Secondary | ICD-10-CM | POA: Diagnosis not present

## 2017-07-27 DIAGNOSIS — Z452 Encounter for adjustment and management of vascular access device: Secondary | ICD-10-CM | POA: Diagnosis not present

## 2017-07-27 DIAGNOSIS — C561 Malignant neoplasm of right ovary: Secondary | ICD-10-CM | POA: Diagnosis not present

## 2017-07-27 DIAGNOSIS — F419 Anxiety disorder, unspecified: Secondary | ICD-10-CM | POA: Diagnosis not present

## 2017-07-27 DIAGNOSIS — Z87891 Personal history of nicotine dependence: Secondary | ICD-10-CM | POA: Diagnosis not present

## 2017-07-27 DIAGNOSIS — C562 Malignant neoplasm of left ovary: Secondary | ICD-10-CM | POA: Diagnosis not present

## 2017-07-27 DIAGNOSIS — Z9181 History of falling: Secondary | ICD-10-CM | POA: Diagnosis not present

## 2017-07-28 DIAGNOSIS — Z452 Encounter for adjustment and management of vascular access device: Secondary | ICD-10-CM | POA: Diagnosis not present

## 2017-07-28 DIAGNOSIS — C561 Malignant neoplasm of right ovary: Secondary | ICD-10-CM | POA: Diagnosis not present

## 2017-07-28 DIAGNOSIS — F419 Anxiety disorder, unspecified: Secondary | ICD-10-CM | POA: Diagnosis not present

## 2017-07-28 DIAGNOSIS — Z483 Aftercare following surgery for neoplasm: Secondary | ICD-10-CM | POA: Diagnosis not present

## 2017-07-28 DIAGNOSIS — Z87891 Personal history of nicotine dependence: Secondary | ICD-10-CM | POA: Diagnosis not present

## 2017-07-28 DIAGNOSIS — Z7982 Long term (current) use of aspirin: Secondary | ICD-10-CM | POA: Diagnosis not present

## 2017-07-28 DIAGNOSIS — I10 Essential (primary) hypertension: Secondary | ICD-10-CM | POA: Diagnosis not present

## 2017-07-28 DIAGNOSIS — Z9181 History of falling: Secondary | ICD-10-CM | POA: Diagnosis not present

## 2017-07-28 DIAGNOSIS — C562 Malignant neoplasm of left ovary: Secondary | ICD-10-CM | POA: Diagnosis not present

## 2017-07-29 DIAGNOSIS — Z466 Encounter for fitting and adjustment of urinary device: Secondary | ICD-10-CM | POA: Diagnosis not present

## 2017-07-29 DIAGNOSIS — Z960 Presence of urogenital implants: Secondary | ICD-10-CM | POA: Diagnosis not present

## 2017-07-31 DIAGNOSIS — C562 Malignant neoplasm of left ovary: Secondary | ICD-10-CM | POA: Diagnosis not present

## 2017-07-31 DIAGNOSIS — C561 Malignant neoplasm of right ovary: Secondary | ICD-10-CM | POA: Diagnosis not present

## 2017-07-31 DIAGNOSIS — C784 Secondary malignant neoplasm of small intestine: Secondary | ICD-10-CM | POA: Diagnosis not present

## 2017-07-31 DIAGNOSIS — C786 Secondary malignant neoplasm of retroperitoneum and peritoneum: Secondary | ICD-10-CM | POA: Diagnosis not present

## 2017-07-31 DIAGNOSIS — C7982 Secondary malignant neoplasm of genital organs: Secondary | ICD-10-CM | POA: Diagnosis not present

## 2017-08-05 DIAGNOSIS — Z9181 History of falling: Secondary | ICD-10-CM | POA: Diagnosis not present

## 2017-08-05 DIAGNOSIS — C561 Malignant neoplasm of right ovary: Secondary | ICD-10-CM | POA: Diagnosis not present

## 2017-08-05 DIAGNOSIS — Z87891 Personal history of nicotine dependence: Secondary | ICD-10-CM | POA: Diagnosis not present

## 2017-08-05 DIAGNOSIS — F419 Anxiety disorder, unspecified: Secondary | ICD-10-CM | POA: Diagnosis not present

## 2017-08-05 DIAGNOSIS — I10 Essential (primary) hypertension: Secondary | ICD-10-CM | POA: Diagnosis not present

## 2017-08-05 DIAGNOSIS — C562 Malignant neoplasm of left ovary: Secondary | ICD-10-CM | POA: Diagnosis not present

## 2017-08-05 DIAGNOSIS — Z452 Encounter for adjustment and management of vascular access device: Secondary | ICD-10-CM | POA: Diagnosis not present

## 2017-08-05 DIAGNOSIS — Z483 Aftercare following surgery for neoplasm: Secondary | ICD-10-CM | POA: Diagnosis not present

## 2017-08-05 DIAGNOSIS — Z7982 Long term (current) use of aspirin: Secondary | ICD-10-CM | POA: Diagnosis not present

## 2017-08-06 DIAGNOSIS — C569 Malignant neoplasm of unspecified ovary: Secondary | ICD-10-CM | POA: Diagnosis not present

## 2017-08-06 DIAGNOSIS — E876 Hypokalemia: Secondary | ICD-10-CM | POA: Diagnosis not present

## 2017-08-06 DIAGNOSIS — I10 Essential (primary) hypertension: Secondary | ICD-10-CM | POA: Diagnosis not present

## 2017-08-06 DIAGNOSIS — R739 Hyperglycemia, unspecified: Secondary | ICD-10-CM | POA: Diagnosis not present

## 2017-08-06 DIAGNOSIS — R638 Other symptoms and signs concerning food and fluid intake: Secondary | ICD-10-CM | POA: Diagnosis not present

## 2017-08-06 DIAGNOSIS — E871 Hypo-osmolality and hyponatremia: Secondary | ICD-10-CM | POA: Diagnosis not present

## 2017-08-06 DIAGNOSIS — R630 Anorexia: Secondary | ICD-10-CM | POA: Diagnosis not present

## 2017-08-06 DIAGNOSIS — K5909 Other constipation: Secondary | ICD-10-CM | POA: Diagnosis not present

## 2017-08-06 DIAGNOSIS — J449 Chronic obstructive pulmonary disease, unspecified: Secondary | ICD-10-CM | POA: Diagnosis not present

## 2017-08-06 DIAGNOSIS — N179 Acute kidney failure, unspecified: Secondary | ICD-10-CM | POA: Diagnosis not present

## 2017-08-06 DIAGNOSIS — I248 Other forms of acute ischemic heart disease: Secondary | ICD-10-CM | POA: Diagnosis not present

## 2017-08-06 DIAGNOSIS — E860 Dehydration: Secondary | ICD-10-CM | POA: Diagnosis not present

## 2017-08-06 DIAGNOSIS — C561 Malignant neoplasm of right ovary: Secondary | ICD-10-CM | POA: Diagnosis not present

## 2017-08-06 DIAGNOSIS — C562 Malignant neoplasm of left ovary: Secondary | ICD-10-CM | POA: Diagnosis not present

## 2017-08-07 DIAGNOSIS — R638 Other symptoms and signs concerning food and fluid intake: Secondary | ICD-10-CM | POA: Diagnosis not present

## 2017-08-07 DIAGNOSIS — K5909 Other constipation: Secondary | ICD-10-CM | POA: Diagnosis not present

## 2017-08-07 DIAGNOSIS — R739 Hyperglycemia, unspecified: Secondary | ICD-10-CM | POA: Diagnosis not present

## 2017-08-07 DIAGNOSIS — C562 Malignant neoplasm of left ovary: Secondary | ICD-10-CM | POA: Diagnosis not present

## 2017-08-07 DIAGNOSIS — J449 Chronic obstructive pulmonary disease, unspecified: Secondary | ICD-10-CM | POA: Diagnosis not present

## 2017-08-07 DIAGNOSIS — C561 Malignant neoplasm of right ovary: Secondary | ICD-10-CM | POA: Diagnosis not present

## 2017-08-07 DIAGNOSIS — R630 Anorexia: Secondary | ICD-10-CM | POA: Diagnosis not present

## 2017-08-07 DIAGNOSIS — N179 Acute kidney failure, unspecified: Secondary | ICD-10-CM | POA: Diagnosis not present

## 2017-08-07 DIAGNOSIS — E860 Dehydration: Secondary | ICD-10-CM | POA: Diagnosis not present

## 2017-08-07 DIAGNOSIS — I10 Essential (primary) hypertension: Secondary | ICD-10-CM | POA: Diagnosis not present

## 2017-08-07 DIAGNOSIS — E871 Hypo-osmolality and hyponatremia: Secondary | ICD-10-CM | POA: Diagnosis not present

## 2017-08-07 DIAGNOSIS — C569 Malignant neoplasm of unspecified ovary: Secondary | ICD-10-CM | POA: Diagnosis not present

## 2017-08-07 DIAGNOSIS — E876 Hypokalemia: Secondary | ICD-10-CM | POA: Diagnosis not present

## 2017-08-07 DIAGNOSIS — I248 Other forms of acute ischemic heart disease: Secondary | ICD-10-CM | POA: Diagnosis not present

## 2017-08-17 DIAGNOSIS — S82102A Unspecified fracture of upper end of left tibia, initial encounter for closed fracture: Secondary | ICD-10-CM | POA: Diagnosis not present

## 2017-08-21 DIAGNOSIS — C561 Malignant neoplasm of right ovary: Secondary | ICD-10-CM | POA: Diagnosis not present

## 2017-08-21 DIAGNOSIS — I10 Essential (primary) hypertension: Secondary | ICD-10-CM | POA: Diagnosis not present

## 2017-08-21 DIAGNOSIS — Z7982 Long term (current) use of aspirin: Secondary | ICD-10-CM | POA: Diagnosis not present

## 2017-08-21 DIAGNOSIS — F419 Anxiety disorder, unspecified: Secondary | ICD-10-CM | POA: Diagnosis not present

## 2017-08-21 DIAGNOSIS — Z87891 Personal history of nicotine dependence: Secondary | ICD-10-CM | POA: Diagnosis not present

## 2017-08-21 DIAGNOSIS — Z9181 History of falling: Secondary | ICD-10-CM | POA: Diagnosis not present

## 2017-08-21 DIAGNOSIS — Z483 Aftercare following surgery for neoplasm: Secondary | ICD-10-CM | POA: Diagnosis not present

## 2017-08-21 DIAGNOSIS — C562 Malignant neoplasm of left ovary: Secondary | ICD-10-CM | POA: Diagnosis not present

## 2017-08-21 DIAGNOSIS — Z452 Encounter for adjustment and management of vascular access device: Secondary | ICD-10-CM | POA: Diagnosis not present

## 2017-09-02 DIAGNOSIS — C785 Secondary malignant neoplasm of large intestine and rectum: Secondary | ICD-10-CM | POA: Diagnosis not present

## 2017-09-02 DIAGNOSIS — R531 Weakness: Secondary | ICD-10-CM | POA: Diagnosis not present

## 2017-09-02 DIAGNOSIS — R5383 Other fatigue: Secondary | ICD-10-CM | POA: Diagnosis not present

## 2017-09-17 DIAGNOSIS — K5900 Constipation, unspecified: Secondary | ICD-10-CM | POA: Diagnosis not present

## 2017-09-17 DIAGNOSIS — I444 Left anterior fascicular block: Secondary | ICD-10-CM | POA: Diagnosis not present

## 2017-09-17 DIAGNOSIS — N183 Chronic kidney disease, stage 3 (moderate): Secondary | ICD-10-CM | POA: Diagnosis not present

## 2017-09-17 DIAGNOSIS — N261 Atrophy of kidney (terminal): Secondary | ICD-10-CM | POA: Diagnosis not present

## 2017-09-17 DIAGNOSIS — E8770 Fluid overload, unspecified: Secondary | ICD-10-CM | POA: Diagnosis not present

## 2017-09-17 DIAGNOSIS — I452 Bifascicular block: Secondary | ICD-10-CM | POA: Diagnosis not present

## 2017-09-17 DIAGNOSIS — R7989 Other specified abnormal findings of blood chemistry: Secondary | ICD-10-CM | POA: Diagnosis not present

## 2017-09-17 DIAGNOSIS — C562 Malignant neoplasm of left ovary: Secondary | ICD-10-CM | POA: Diagnosis not present

## 2017-09-17 DIAGNOSIS — N179 Acute kidney failure, unspecified: Secondary | ICD-10-CM | POA: Diagnosis not present

## 2017-09-17 DIAGNOSIS — R509 Fever, unspecified: Secondary | ICD-10-CM | POA: Diagnosis not present

## 2017-09-17 DIAGNOSIS — R51 Headache: Secondary | ICD-10-CM | POA: Diagnosis not present

## 2017-09-17 DIAGNOSIS — Z9221 Personal history of antineoplastic chemotherapy: Secondary | ICD-10-CM | POA: Diagnosis not present

## 2017-09-17 DIAGNOSIS — N170 Acute kidney failure with tubular necrosis: Secondary | ICD-10-CM | POA: Diagnosis not present

## 2017-09-17 DIAGNOSIS — J449 Chronic obstructive pulmonary disease, unspecified: Secondary | ICD-10-CM | POA: Diagnosis not present

## 2017-09-17 DIAGNOSIS — R600 Localized edema: Secondary | ICD-10-CM | POA: Diagnosis not present

## 2017-09-17 DIAGNOSIS — I517 Cardiomegaly: Secondary | ICD-10-CM | POA: Diagnosis not present

## 2017-09-17 DIAGNOSIS — I160 Hypertensive urgency: Secondary | ICD-10-CM | POA: Diagnosis not present

## 2017-09-17 DIAGNOSIS — N390 Urinary tract infection, site not specified: Secondary | ICD-10-CM | POA: Diagnosis not present

## 2017-09-17 DIAGNOSIS — Z9114 Patient's other noncompliance with medication regimen: Secondary | ICD-10-CM | POA: Diagnosis not present

## 2017-09-17 DIAGNOSIS — Z87891 Personal history of nicotine dependence: Secondary | ICD-10-CM | POA: Diagnosis not present

## 2017-09-17 DIAGNOSIS — I4510 Unspecified right bundle-branch block: Secondary | ICD-10-CM | POA: Diagnosis not present

## 2017-09-17 DIAGNOSIS — I10 Essential (primary) hypertension: Secondary | ICD-10-CM | POA: Diagnosis not present

## 2017-09-17 DIAGNOSIS — E785 Hyperlipidemia, unspecified: Secondary | ICD-10-CM | POA: Diagnosis not present

## 2017-09-17 DIAGNOSIS — C786 Secondary malignant neoplasm of retroperitoneum and peritoneum: Secondary | ICD-10-CM | POA: Diagnosis not present

## 2017-09-17 DIAGNOSIS — Z9889 Other specified postprocedural states: Secondary | ICD-10-CM | POA: Diagnosis not present

## 2017-09-17 DIAGNOSIS — N189 Chronic kidney disease, unspecified: Secondary | ICD-10-CM | POA: Diagnosis not present

## 2017-09-17 DIAGNOSIS — E7800 Pure hypercholesterolemia, unspecified: Secondary | ICD-10-CM | POA: Diagnosis not present

## 2017-09-17 DIAGNOSIS — I161 Hypertensive emergency: Secondary | ICD-10-CM | POA: Diagnosis not present

## 2017-09-17 DIAGNOSIS — E872 Acidosis: Secondary | ICD-10-CM | POA: Diagnosis not present

## 2017-09-17 DIAGNOSIS — Z79899 Other long term (current) drug therapy: Secondary | ICD-10-CM | POA: Diagnosis not present

## 2017-09-17 DIAGNOSIS — I129 Hypertensive chronic kidney disease with stage 1 through stage 4 chronic kidney disease, or unspecified chronic kidney disease: Secondary | ICD-10-CM | POA: Diagnosis not present

## 2017-09-17 DIAGNOSIS — C569 Malignant neoplasm of unspecified ovary: Secondary | ICD-10-CM | POA: Diagnosis not present

## 2017-09-17 DIAGNOSIS — Z933 Colostomy status: Secondary | ICD-10-CM | POA: Diagnosis not present

## 2017-09-17 DIAGNOSIS — C561 Malignant neoplasm of right ovary: Secondary | ICD-10-CM | POA: Diagnosis not present

## 2017-09-17 DIAGNOSIS — Z4889 Encounter for other specified surgical aftercare: Secondary | ICD-10-CM | POA: Diagnosis not present

## 2017-09-18 DIAGNOSIS — E785 Hyperlipidemia, unspecified: Secondary | ICD-10-CM | POA: Diagnosis not present

## 2017-09-18 DIAGNOSIS — C569 Malignant neoplasm of unspecified ovary: Secondary | ICD-10-CM | POA: Diagnosis not present

## 2017-09-18 DIAGNOSIS — N189 Chronic kidney disease, unspecified: Secondary | ICD-10-CM | POA: Diagnosis not present

## 2017-09-18 DIAGNOSIS — N179 Acute kidney failure, unspecified: Secondary | ICD-10-CM | POA: Diagnosis not present

## 2017-09-18 DIAGNOSIS — K5900 Constipation, unspecified: Secondary | ICD-10-CM | POA: Diagnosis not present

## 2017-09-18 DIAGNOSIS — I161 Hypertensive emergency: Secondary | ICD-10-CM | POA: Diagnosis not present

## 2017-09-19 DIAGNOSIS — I161 Hypertensive emergency: Secondary | ICD-10-CM | POA: Diagnosis not present

## 2017-09-19 DIAGNOSIS — C569 Malignant neoplasm of unspecified ovary: Secondary | ICD-10-CM | POA: Diagnosis not present

## 2017-09-19 DIAGNOSIS — I129 Hypertensive chronic kidney disease with stage 1 through stage 4 chronic kidney disease, or unspecified chronic kidney disease: Secondary | ICD-10-CM | POA: Diagnosis not present

## 2017-09-19 DIAGNOSIS — N179 Acute kidney failure, unspecified: Secondary | ICD-10-CM | POA: Diagnosis not present

## 2017-09-19 DIAGNOSIS — E785 Hyperlipidemia, unspecified: Secondary | ICD-10-CM | POA: Diagnosis not present

## 2017-09-19 DIAGNOSIS — Z933 Colostomy status: Secondary | ICD-10-CM | POA: Diagnosis not present

## 2017-09-19 DIAGNOSIS — N189 Chronic kidney disease, unspecified: Secondary | ICD-10-CM | POA: Diagnosis not present

## 2017-09-19 DIAGNOSIS — K5900 Constipation, unspecified: Secondary | ICD-10-CM | POA: Diagnosis not present

## 2017-09-19 DIAGNOSIS — Z9889 Other specified postprocedural states: Secondary | ICD-10-CM | POA: Diagnosis not present

## 2017-09-20 DIAGNOSIS — I517 Cardiomegaly: Secondary | ICD-10-CM | POA: Diagnosis not present

## 2017-09-20 DIAGNOSIS — I4510 Unspecified right bundle-branch block: Secondary | ICD-10-CM | POA: Diagnosis not present

## 2017-09-20 DIAGNOSIS — I444 Left anterior fascicular block: Secondary | ICD-10-CM | POA: Diagnosis not present

## 2017-09-30 DIAGNOSIS — N3000 Acute cystitis without hematuria: Secondary | ICD-10-CM | POA: Diagnosis not present

## 2017-09-30 DIAGNOSIS — N184 Chronic kidney disease, stage 4 (severe): Secondary | ICD-10-CM | POA: Diagnosis not present

## 2017-09-30 DIAGNOSIS — I160 Hypertensive urgency: Secondary | ICD-10-CM | POA: Diagnosis not present

## 2017-09-30 DIAGNOSIS — R600 Localized edema: Secondary | ICD-10-CM | POA: Diagnosis not present

## 2017-10-14 DIAGNOSIS — C7982 Secondary malignant neoplasm of genital organs: Secondary | ICD-10-CM | POA: Diagnosis not present

## 2017-10-14 DIAGNOSIS — C561 Malignant neoplasm of right ovary: Secondary | ICD-10-CM | POA: Diagnosis not present

## 2017-10-14 DIAGNOSIS — C786 Secondary malignant neoplasm of retroperitoneum and peritoneum: Secondary | ICD-10-CM | POA: Diagnosis not present

## 2017-10-14 DIAGNOSIS — C562 Malignant neoplasm of left ovary: Secondary | ICD-10-CM | POA: Diagnosis not present

## 2017-10-16 DIAGNOSIS — D649 Anemia, unspecified: Secondary | ICD-10-CM | POA: Diagnosis not present

## 2017-10-16 DIAGNOSIS — C786 Secondary malignant neoplasm of retroperitoneum and peritoneum: Secondary | ICD-10-CM | POA: Diagnosis not present

## 2017-10-16 DIAGNOSIS — F5089 Other specified eating disorder: Secondary | ICD-10-CM | POA: Diagnosis not present

## 2017-10-16 DIAGNOSIS — Z9221 Personal history of antineoplastic chemotherapy: Secondary | ICD-10-CM | POA: Diagnosis not present

## 2017-10-16 DIAGNOSIS — D569 Thalassemia, unspecified: Secondary | ICD-10-CM | POA: Diagnosis not present

## 2017-10-16 DIAGNOSIS — Z7952 Long term (current) use of systemic steroids: Secondary | ICD-10-CM | POA: Diagnosis not present

## 2017-10-16 DIAGNOSIS — C561 Malignant neoplasm of right ovary: Secondary | ICD-10-CM | POA: Diagnosis not present

## 2017-10-16 DIAGNOSIS — C562 Malignant neoplasm of left ovary: Secondary | ICD-10-CM | POA: Diagnosis not present

## 2017-10-16 DIAGNOSIS — C7982 Secondary malignant neoplasm of genital organs: Secondary | ICD-10-CM | POA: Diagnosis not present

## 2017-10-20 DIAGNOSIS — E875 Hyperkalemia: Secondary | ICD-10-CM | POA: Diagnosis not present

## 2017-10-20 DIAGNOSIS — N179 Acute kidney failure, unspecified: Secondary | ICD-10-CM | POA: Diagnosis not present

## 2017-10-20 DIAGNOSIS — C569 Malignant neoplasm of unspecified ovary: Secondary | ICD-10-CM | POA: Diagnosis not present

## 2017-10-20 DIAGNOSIS — I4510 Unspecified right bundle-branch block: Secondary | ICD-10-CM | POA: Diagnosis not present

## 2017-10-20 DIAGNOSIS — R197 Diarrhea, unspecified: Secondary | ICD-10-CM | POA: Diagnosis not present

## 2017-10-20 DIAGNOSIS — I4519 Other right bundle-branch block: Secondary | ICD-10-CM | POA: Diagnosis not present

## 2017-10-20 DIAGNOSIS — I959 Hypotension, unspecified: Secondary | ICD-10-CM | POA: Diagnosis not present

## 2017-10-20 DIAGNOSIS — R270 Ataxia, unspecified: Secondary | ICD-10-CM | POA: Diagnosis not present

## 2017-10-20 DIAGNOSIS — R4182 Altered mental status, unspecified: Secondary | ICD-10-CM | POA: Diagnosis not present

## 2017-10-20 DIAGNOSIS — E860 Dehydration: Secondary | ICD-10-CM | POA: Diagnosis not present

## 2017-10-20 DIAGNOSIS — R531 Weakness: Secondary | ICD-10-CM | POA: Diagnosis not present

## 2017-10-20 DIAGNOSIS — N390 Urinary tract infection, site not specified: Secondary | ICD-10-CM | POA: Diagnosis not present

## 2017-10-21 DIAGNOSIS — E872 Acidosis: Secondary | ICD-10-CM | POA: Diagnosis not present

## 2017-10-21 DIAGNOSIS — R197 Diarrhea, unspecified: Secondary | ICD-10-CM | POA: Diagnosis not present

## 2017-10-21 DIAGNOSIS — E860 Dehydration: Secondary | ICD-10-CM | POA: Diagnosis not present

## 2017-10-21 DIAGNOSIS — C569 Malignant neoplasm of unspecified ovary: Secondary | ICD-10-CM | POA: Diagnosis not present

## 2017-10-21 DIAGNOSIS — Z9049 Acquired absence of other specified parts of digestive tract: Secondary | ICD-10-CM | POA: Diagnosis not present

## 2017-10-21 DIAGNOSIS — N183 Chronic kidney disease, stage 3 (moderate): Secondary | ICD-10-CM | POA: Diagnosis not present

## 2017-10-21 DIAGNOSIS — C561 Malignant neoplasm of right ovary: Secondary | ICD-10-CM | POA: Diagnosis not present

## 2017-10-21 DIAGNOSIS — Z66 Do not resuscitate: Secondary | ICD-10-CM | POA: Diagnosis not present

## 2017-10-21 DIAGNOSIS — R270 Ataxia, unspecified: Secondary | ICD-10-CM | POA: Diagnosis not present

## 2017-10-21 DIAGNOSIS — J449 Chronic obstructive pulmonary disease, unspecified: Secondary | ICD-10-CM | POA: Diagnosis not present

## 2017-10-21 DIAGNOSIS — N179 Acute kidney failure, unspecified: Secondary | ICD-10-CM | POA: Diagnosis not present

## 2017-10-21 DIAGNOSIS — E875 Hyperkalemia: Secondary | ICD-10-CM | POA: Diagnosis not present

## 2017-10-21 DIAGNOSIS — D631 Anemia in chronic kidney disease: Secondary | ICD-10-CM | POA: Diagnosis not present

## 2017-10-21 DIAGNOSIS — N390 Urinary tract infection, site not specified: Secondary | ICD-10-CM | POA: Diagnosis not present

## 2017-10-21 DIAGNOSIS — C562 Malignant neoplasm of left ovary: Secondary | ICD-10-CM | POA: Diagnosis not present

## 2017-10-21 DIAGNOSIS — I4519 Other right bundle-branch block: Secondary | ICD-10-CM | POA: Diagnosis not present

## 2017-10-21 DIAGNOSIS — R4182 Altered mental status, unspecified: Secondary | ICD-10-CM | POA: Diagnosis not present

## 2017-10-21 DIAGNOSIS — R279 Unspecified lack of coordination: Secondary | ICD-10-CM | POA: Diagnosis not present

## 2017-10-21 DIAGNOSIS — R531 Weakness: Secondary | ICD-10-CM | POA: Diagnosis not present

## 2017-10-21 DIAGNOSIS — I444 Left anterior fascicular block: Secondary | ICD-10-CM | POA: Diagnosis not present

## 2017-10-21 DIAGNOSIS — I129 Hypertensive chronic kidney disease with stage 1 through stage 4 chronic kidney disease, or unspecified chronic kidney disease: Secondary | ICD-10-CM | POA: Diagnosis not present

## 2017-10-21 DIAGNOSIS — I452 Bifascicular block: Secondary | ICD-10-CM | POA: Diagnosis not present

## 2017-10-21 DIAGNOSIS — N189 Chronic kidney disease, unspecified: Secondary | ICD-10-CM | POA: Diagnosis not present

## 2017-10-21 DIAGNOSIS — Z743 Need for continuous supervision: Secondary | ICD-10-CM | POA: Diagnosis not present

## 2017-10-21 DIAGNOSIS — Z515 Encounter for palliative care: Secondary | ICD-10-CM | POA: Diagnosis not present

## 2017-11-05 DEATH — deceased

## 2018-04-29 IMAGING — MG DIAGNOSTIC NEEDLE LOC MAMMO RIGHT
1 series · 1 of 1 positions shown · non-contrast
Comparison: Previous exams.

CLINICAL DATA: Recently diagnosed right breast atypical ductal
hyperplasia associated with an intraductal papilloma.

EXAM:
NEEDLE LOCALIZATION OF THE RIGHT BREAST WITH MAMMO GUIDANCE

[R CC]
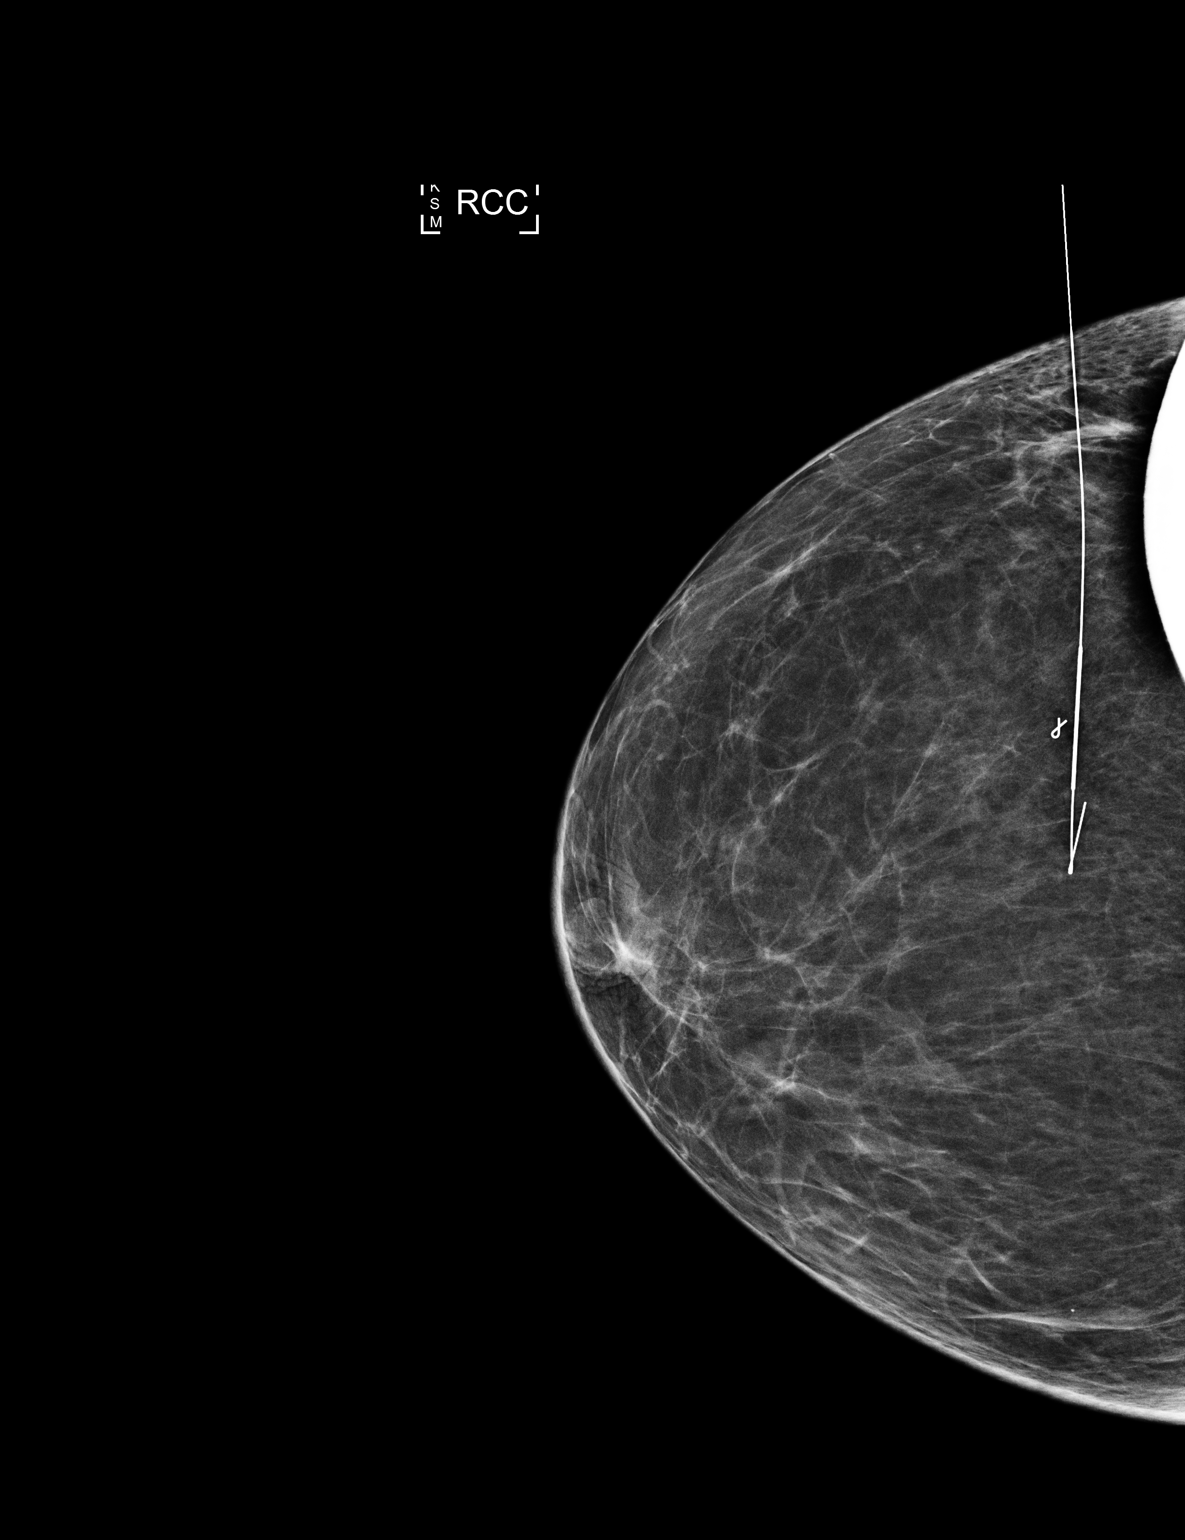

[1 of 1 positions shown; findings below may reference images not displayed]

FINDINGS: Patient presents for needle localization prior to right breast
surgical excision. I met with the patient and we discussed the
procedure of needle localization including benefits and
alternatives. We discussed the high likelihood of a successful
procedure. We discussed the risks of the procedure, including
infection, bleeding, tissue injury, and further surgery. Informed,
written consent was given. The usual time-out protocol was performed
immediately prior to the procedure.

Using mammographic guidance, sterile technique, 1% lidocaine and a 9
cm modified Kopans needle, the recently placed ribbon shaped biopsy
marker clip in the lower outer quadrant of the right breast was
localized using lateral approach. The images were marked for Dr.
Giorgi.
IMPRESSION: Needle localization right breast. No apparent complications.

## 2018-04-29 IMAGING — MG BREAST SURGICAL SPECIMEN
1 series · 1 of 1 positions shown · non-contrast
Comparison: Previous exam(s).

CLINICAL DATA: Surgical excision of right breast atypical ductal
hyperplasia associated with an intraductal papilloma.

EXAM:
SPECIMEN RADIOGRAPH OF THE RIGHT BREAST

[R]
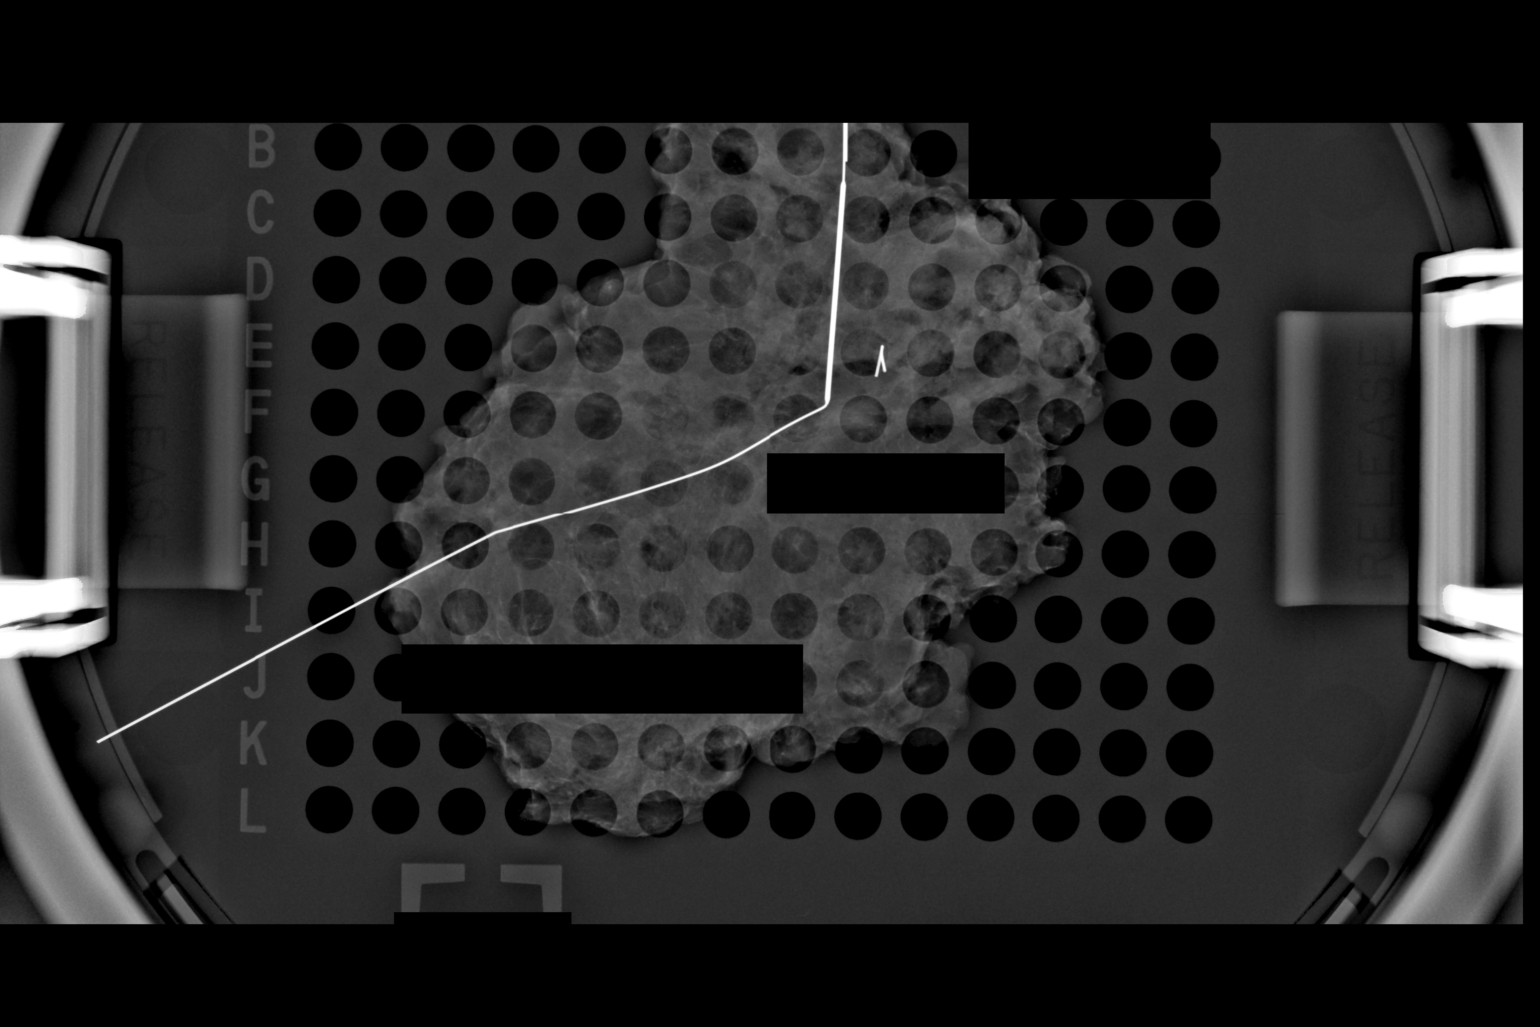

[1 of 1 positions shown; findings below may reference images not displayed]

FINDINGS: Status post excision of the right breast. The wire tip and biopsy
marker clip are present and the clip was marked for pathology.
IMPRESSION: Specimen radiograph of the right breast.
